# Patient Record
Sex: Male | Born: 1985 | Race: Black or African American | Hispanic: No | Marital: Single | Smoking: Current every day smoker
Health system: Southern US, Community
[De-identification: ages and names within clinical notes are randomized; demographics above are authoritative.]

## PROBLEM LIST (undated history)

## (undated) HISTORY — PX: HERNIA REPAIR: SHX51

---
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
## 2013-07-06 ENCOUNTER — Emergency Department (HOSPITAL_COMMUNITY)
Admission: EM | Admit: 2013-07-06 | Discharge: 2013-07-06 | Disposition: A | Discharge status: Home / Self Care | Payer: Self-pay | Attending: Emergency Medicine | Admitting: Emergency Medicine

## 2013-07-06 ENCOUNTER — Encounter (HOSPITAL_COMMUNITY): Payer: Self-pay | Admitting: Emergency Medicine

## 2013-07-06 DIAGNOSIS — IMO0002 Reserved for concepts with insufficient information to code with codable children: Secondary | ICD-10-CM | POA: Insufficient documentation

## 2013-07-06 DIAGNOSIS — Y9389 Activity, other specified: Secondary | ICD-10-CM | POA: Insufficient documentation

## 2013-07-06 DIAGNOSIS — M549 Dorsalgia, unspecified: Secondary | ICD-10-CM

## 2013-07-06 DIAGNOSIS — Y92410 Unspecified street and highway as the place of occurrence of the external cause: Secondary | ICD-10-CM | POA: Insufficient documentation

## 2013-07-06 MED ORDER — IBUPROFEN 400 MG PO TABS
800.0000 mg | ORAL_TABLET | Freq: Once | ORAL | Status: AC
  Administered 2013-07-06: 800 mg via ORAL
  Filled 2013-07-06: qty 2

## 2013-07-06 MED ORDER — NAPROXEN 500 MG PO TABS: 500.0000 mg | ORAL_TABLET | Freq: Two times a day (BID) | ORAL | Status: DC
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
## 2013-07-10 NOTE — ED Provider Notes (Signed)
Medical screening examination/treatment/procedure(s) were performed by non-physician practitioner and as supervising physician I was immediately available for consultation/collaboration.  EKG Interpretation   None        Derwood Kaplan, MD 07/10/13 1631

## 2015-03-26 ENCOUNTER — Emergency Department (HOSPITAL_COMMUNITY)
Admission: EM | Admit: 2015-03-26 | Discharge: 2015-03-26 | Discharge status: Against Medical Advice | Payer: Self-pay | Attending: Emergency Medicine | Admitting: Emergency Medicine

## 2015-03-26 ENCOUNTER — Encounter (HOSPITAL_COMMUNITY): Payer: Self-pay | Admitting: Emergency Medicine

## 2015-03-26 DIAGNOSIS — Z720 Tobacco use: Secondary | ICD-10-CM | POA: Insufficient documentation

## 2015-03-26 DIAGNOSIS — E876 Hypokalemia: Secondary | ICD-10-CM | POA: Insufficient documentation

## 2015-03-26 DIAGNOSIS — M62838 Other muscle spasm: Secondary | ICD-10-CM | POA: Insufficient documentation

## 2015-03-26 LAB — CBC
HEMATOCRIT: 41.4 % (ref 39.0–52.0)
HEMOGLOBIN: 13.8 g/dL (ref 13.0–17.0)
MCH: 30.1 pg (ref 26.0–34.0)
MCHC: 33.3 g/dL (ref 30.0–36.0)
MCV: 90.2 fL (ref 78.0–100.0)
Platelets: 293 10*3/uL (ref 150–400)
RBC: 4.59 MIL/uL (ref 4.22–5.81)
RDW: 12.5 % (ref 11.5–15.5)
WBC: 5.9 10*3/uL (ref 4.0–10.5)

## 2015-03-26 LAB — BASIC METABOLIC PANEL
Anion gap: 12 (ref 5–15)
BUN: 10 mg/dL (ref 6–20)
CALCIUM: 8.9 mg/dL (ref 8.9–10.3)
CHLORIDE: 97 mmol/L — AB (ref 101–111)
CO2: 23 mmol/L (ref 22–32)
Creatinine, Ser: 0.98 mg/dL (ref 0.61–1.24)
GFR calc non Af Amer: >60 mL/min (ref >60)
Glucose, Bld: 88 mg/dL (ref 65–99)
Potassium: 2.9 mmol/L — ABNORMAL LOW (ref 3.5–5.1)
Sodium: 132 mmol/L — ABNORMAL LOW (ref 135–145)

## 2015-03-26 LAB — MAGNESIUM: Magnesium: 2.1 mg/dL (ref 1.7–2.4)

## 2015-03-26 MED ORDER — POTASSIUM CITRATE ER 10 MEQ (1080 MG) PO TBCR
20.0000 meq | EXTENDED_RELEASE_TABLET | Freq: Once | ORAL | Status: DC
  Filled 2015-03-26: qty 2

## 2015-03-26 MED ORDER — POTASSIUM CHLORIDE CRYS ER 20 MEQ PO TBCR
40.0000 meq | EXTENDED_RELEASE_TABLET | Freq: Once | ORAL | Status: AC
  Administered 2015-03-26: 40 meq via ORAL
  Filled 2015-03-26: qty 2

## 2015-03-26 NOTE — ED Notes (Signed)
Pt sts cramp in abd muscle today that is still sore; pt sts hx of same in past; pt sts happened while straining to have BM

## 2015-03-26 NOTE — ED Notes (Signed)
Pt signed out AMA, stated "I just can't wait", informed provider.  Instructed pt on warning signs to look for to return back to the ED.

## 2015-03-26 NOTE — ED Notes (Signed)
Pt reports muscle cramps "for years" and can tell when it is going to happen.  Muscle cramps in both legs at this time.

## 2015-03-26 NOTE — ED Provider Notes (Signed)
CSN: [id number]     Arrival date & time 03/26/15  4098 History   First MD Initiated Contact with Patient 03/26/15 1932     Chief Complaint  Patient presents with  . Abdominal Pain   (Consider location/radiation/quality/duration/timing/severity/associated sxs/prior Treatment) HPI Patient is a previously healthy 29 year old [name] presented today for reports of spasms in his abdominal muscles. Patient reports over the last several years has had intermittent spasming of the muscles of his abdomen and his legs. Today he had episode where spasm occurred in his right abdominal wall and continued for several hours. Reports only mild nonradiating pain with this. Reports this resolved on its own but lasted longer than usual and wanted to be evaluated. On arrival to the emergency department reports spasming of the back muscles of his right leg. Denies any alleviating or aggravating factors for this. He does admit days had diarrhea for several months and has not had evaluation for this.  Denies any bloody or fatty stools.  History reviewed. No pertinent past medical history. History reviewed. No pertinent past surgical history. History reviewed. No pertinent family history. History  Substance Use Topics  . Smoking status: Current Every Day Smoker  . Smokeless tobacco: Not on file  . Alcohol Use: Yes    Review of Systems  Constitutional: Negative for fever and chills.  HENT: Negative for congestion and sore throat.   Eyes: Negative for pain.  Respiratory: Negative for cough and shortness of breath.   Cardiovascular: Negative for chest pain and palpitations.  Gastrointestinal: Negative for nausea, vomiting, abdominal pain and diarrhea.  Endocrine: Negative.   Genitourinary: Negative for flank pain.  Musculoskeletal: Negative for back pain and neck pain.       Muscle spasms of stomach and legs   Skin: Negative for rash.  Allergic/Immunologic: Negative.   Neurological: Negative for dizziness,  syncope and light-headedness.  Psychiatric/Behavioral: Negative for confusion.   Allergies  Review of patient's allergies indicates no known allergies.  Home Medications   Prior to Admission medications   Medication Sig Start Date End Date Taking? Authorizing Provider  naproxen (NAPROSYN) 500 MG tablet Take 1 tablet (500 mg total) by mouth 2 (two) times daily. Patient not taking: Reported on 03/26/2015 07/06/13   Tatyana Kirichenko, PA-C   BP 114/84 mmHg  Pulse 76  Temp(Src) 98.2 F (36.8 C) (Oral)  Resp 16  SpO2 99% Physical Exam  Constitutional: He is oriented to person, place, and time. He appears well-developed and well-nourished.  HENT:  Head: Normocephalic and atraumatic.  Eyes: Conjunctivae and EOM are normal. Pupils are equal, round, and reactive to light.  Neck: Normal range of motion. Neck supple.  Cardiovascular: Normal rate, regular rhythm, normal heart sounds and intact distal pulses.   Pulmonary/Chest: Effort normal and breath sounds normal. No respiratory distress.  Abdominal: Soft. Bowel sounds are normal. There is no tenderness.  Musculoskeletal: Normal range of motion.  Full ROM of all major joints with no spasms palpated.   Neurological: He is alert and oriented to person, place, and time. He has normal reflexes. No cranial nerve deficit.  Skin: Skin is warm and dry.    ED Course  Procedures (including critical care time) Labs Review Labs Reviewed  BASIC METABOLIC PANEL - Abnormal; Notable for the following:    Sodium 132 (*)    Potassium 2.9 (*)    Chloride 97 (*)    All other components within normal limits  CBC  MAGNESIUM   Imaging Review No results found.  EKG Interpretation None      MDM   Final diagnoses:  None   On initial evaluation patient was hemodynamically stable and in no acute distress. Abdomen was soft with no tenderness. Doubt any acute intra-abdominal pathology. Did not palpate any muscle spasm of the time. Felt most likely  due to electrolyte abnormality. BMP performed showing a potassium of 2.9. Ordered for patient to receive oral potassium replacement but patient eloped from the emergency department prior to my delivering of results, giving return precautions, or prescribing potassium for home use.  Advised pt to obtain PCP for condition during initial evaluation however.   If performed, labs, EKGs, and imaging were reviewed/interpreted by myself and my attending and incorporated into medical decision making..  Pt care supervised by my attending Dr. Donnald Garre.   Tery Sanfilippo, MD PGY-2  Emergency Medicine     Tery Sanfilippo, MD 03/27/15 1213  Arby Barrette, MD 04/02/15 1900

## 2015-06-14 ENCOUNTER — Emergency Department (HOSPITAL_COMMUNITY)
Admission: EM | Admit: 2015-06-14 | Discharge: 2015-06-14 | Disposition: A | Discharge status: Home / Self Care | Payer: Self-pay | Attending: Emergency Medicine | Admitting: Emergency Medicine

## 2015-06-14 ENCOUNTER — Emergency Department (HOSPITAL_COMMUNITY): Payer: Self-pay

## 2015-06-14 ENCOUNTER — Encounter (HOSPITAL_COMMUNITY): Payer: Self-pay | Admitting: *Deleted

## 2015-06-14 DIAGNOSIS — S3991XA Unspecified injury of abdomen, initial encounter: Secondary | ICD-10-CM | POA: Insufficient documentation

## 2015-06-14 DIAGNOSIS — Y998 Other external cause status: Secondary | ICD-10-CM | POA: Insufficient documentation

## 2015-06-14 DIAGNOSIS — Z720 Tobacco use: Secondary | ICD-10-CM | POA: Insufficient documentation

## 2015-06-14 DIAGNOSIS — Y92410 Unspecified street and highway as the place of occurrence of the external cause: Secondary | ICD-10-CM | POA: Insufficient documentation

## 2015-06-14 DIAGNOSIS — S3992XA Unspecified injury of lower back, initial encounter: Secondary | ICD-10-CM | POA: Insufficient documentation

## 2015-06-14 DIAGNOSIS — S0083XA Contusion of other part of head, initial encounter: Secondary | ICD-10-CM | POA: Insufficient documentation

## 2015-06-14 DIAGNOSIS — Y9389 Activity, other specified: Secondary | ICD-10-CM | POA: Insufficient documentation

## 2015-06-14 DIAGNOSIS — S0990XA Unspecified injury of head, initial encounter: Secondary | ICD-10-CM | POA: Insufficient documentation

## 2015-06-14 LAB — COMPREHENSIVE METABOLIC PANEL
ALK PHOS: 64 U/L (ref 38–126)
ALT: 18 U/L (ref 17–63)
AST: 26 U/L (ref 15–41)
Albumin: 4.7 g/dL (ref 3.5–5.0)
Anion gap: 8 (ref 5–15)
BUN: 12 mg/dL (ref 6–20)
CALCIUM: 9 mg/dL (ref 8.9–10.3)
CO2: 24 mmol/L (ref 22–32)
CREATININE: 0.73 mg/dL (ref 0.61–1.24)
Chloride: 109 mmol/L (ref 101–111)
GFR calc non Af Amer: >60 mL/min (ref >60)
Glucose, Bld: 105 mg/dL — ABNORMAL HIGH (ref 65–99)
Potassium: 3.8 mmol/L (ref 3.5–5.1)
SODIUM: 141 mmol/L (ref 135–145)
Total Bilirubin: 0.9 mg/dL (ref 0.3–1.2)
Total Protein: 7.5 g/dL (ref 6.5–8.1)

## 2015-06-14 LAB — URINALYSIS, ROUTINE W REFLEX MICROSCOPIC
BILIRUBIN URINE: NEGATIVE
Glucose, UA: NEGATIVE mg/dL
HGB URINE DIPSTICK: NEGATIVE
KETONES UR: NEGATIVE mg/dL
Leukocytes, UA: NEGATIVE
Nitrite: NEGATIVE
PROTEIN: NEGATIVE mg/dL
Specific Gravity, Urine: 1.017 (ref 1.005–1.030)
Urobilinogen, UA: 1 mg/dL (ref 0.0–1.0)
pH: 6 (ref 5.0–8.0)

## 2015-06-14 LAB — CBC WITH DIFFERENTIAL/PLATELET
Basophils Absolute: 0 10*3/uL (ref 0.0–0.1)
Basophils Relative: 0 %
Eosinophils Absolute: 0.2 10*3/uL (ref 0.0–0.7)
Eosinophils Relative: 4 %
HCT: 39.5 % (ref 39.0–52.0)
HEMOGLOBIN: 12.9 g/dL — AB (ref 13.0–17.0)
LYMPHS ABS: 1.2 10*3/uL (ref 0.7–4.0)
LYMPHS PCT: 21 %
MCH: 29.5 pg (ref 26.0–34.0)
MCHC: 32.7 g/dL (ref 30.0–36.0)
MCV: 90.2 fL (ref 78.0–100.0)
Monocytes Absolute: 0.5 10*3/uL (ref 0.1–1.0)
Monocytes Relative: 8 %
NEUTROS PCT: 67 %
Neutro Abs: 3.8 10*3/uL (ref 1.7–7.7)
Platelets: 310 10*3/uL (ref 150–400)
RBC: 4.38 MIL/uL (ref 4.22–5.81)
RDW: 12.4 % (ref 11.5–15.5)
WBC: 5.8 10*3/uL (ref 4.0–10.5)

## 2015-06-14 LAB — ETHANOL: ALCOHOL ETHYL (B): 204 mg/dL — AB (ref <5)

## 2015-06-14 LAB — LIPASE, BLOOD: Lipase: 44 U/L (ref 22–51)

## 2015-06-14 LAB — I-STAT CG4 LACTIC ACID, ED: LACTIC ACID, VENOUS: 1.73 mmol/L (ref 0.5–2.0)

## 2015-06-14 MED ORDER — HYDROCODONE-ACETAMINOPHEN 5-325 MG PO TABS
2.0000 | ORAL_TABLET | Freq: Once | ORAL | Status: AC
  Administered 2015-06-14: 2 via ORAL
  Filled 2015-06-14: qty 2

## 2015-06-14 MED ORDER — SODIUM CHLORIDE 0.9 % IV BOLUS (SEPSIS)
1000.0000 mL | Freq: Once | INTRAVENOUS | Status: AC
  Administered 2015-06-14: 1000 mL via INTRAVENOUS

## 2015-06-14 MED ORDER — CYCLOBENZAPRINE HCL 10 MG PO TABS
10.0000 mg | ORAL_TABLET | Freq: Once | ORAL | Status: AC
  Administered 2015-06-14: 10 mg via ORAL
  Filled 2015-06-14: qty 1

## 2015-06-14 MED ORDER — CYCLOBENZAPRINE HCL 10 MG PO TABS: 10.0000 mg | ORAL_TABLET | Freq: Two times a day (BID) | ORAL | Status: DC | PRN

## 2015-06-14 MED ORDER — IBUPROFEN 800 MG PO TABS: 800.0000 mg | ORAL_TABLET | Freq: Three times a day (TID) | ORAL | Status: DC

## 2015-06-14 MED ORDER — IOHEXOL 350 MG/ML SOLN
100.0000 mL | Freq: Once | INTRAVENOUS | Status: AC | PRN
  Administered 2015-06-14: 100 mL via INTRAVENOUS
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
## 2015-06-14 NOTE — ED Notes (Signed)
Patient transported to X-ray 
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
## 2016-10-08 ENCOUNTER — Encounter (HOSPITAL_COMMUNITY): Payer: Self-pay | Admitting: Emergency Medicine

## 2016-10-08 ENCOUNTER — Ambulatory Visit (HOSPITAL_COMMUNITY)
Admission: EM | Admit: 2016-10-08 | Discharge: 2016-10-08 | Disposition: A | Discharge status: Home / Self Care | Payer: Self-pay | Attending: Family Medicine | Admitting: Family Medicine

## 2016-10-08 DIAGNOSIS — Z202 Contact with and (suspected) exposure to infections with a predominantly sexual mode of transmission: Secondary | ICD-10-CM

## 2016-10-08 DIAGNOSIS — F17210 Nicotine dependence, cigarettes, uncomplicated: Secondary | ICD-10-CM | POA: Insufficient documentation

## 2016-10-08 MED ORDER — METRONIDAZOLE 500 MG PO TABS
ORAL_TABLET | ORAL | Status: AC
  Filled 2016-10-08: qty 4

## 2016-10-08 MED ORDER — METRONIDAZOLE 500 MG PO TABS
2000.0000 mg | ORAL_TABLET | Freq: Once | ORAL | Status: AC
  Administered 2016-10-08: 2000 mg via ORAL
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
## 2016-10-08 NOTE — ED Notes (Signed)
Dirty urine collected. 

## 2016-10-09 LAB — URINE CYTOLOGY ANCILLARY ONLY
Chlamydia: NEGATIVE
Neisseria Gonorrhea: NEGATIVE
Trichomonas: NEGATIVE
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
## 2018-07-18 ENCOUNTER — Encounter (HOSPITAL_COMMUNITY): Payer: Self-pay | Admitting: Emergency Medicine

## 2018-07-18 ENCOUNTER — Emergency Department (HOSPITAL_COMMUNITY): Payer: Self-pay

## 2018-07-18 ENCOUNTER — Emergency Department (HOSPITAL_COMMUNITY)
Admission: EM | Admit: 2018-07-18 | Discharge: 2018-07-18 | Disposition: A | Discharge status: Home / Self Care | Payer: Self-pay | Attending: Emergency Medicine | Admitting: Emergency Medicine

## 2018-07-18 DIAGNOSIS — R1031 Right lower quadrant pain: Secondary | ICD-10-CM | POA: Insufficient documentation

## 2018-07-18 DIAGNOSIS — F17210 Nicotine dependence, cigarettes, uncomplicated: Secondary | ICD-10-CM | POA: Insufficient documentation

## 2018-07-18 LAB — URINALYSIS, ROUTINE W REFLEX MICROSCOPIC
BILIRUBIN URINE: NEGATIVE
GLUCOSE, UA: NEGATIVE mg/dL
Hgb urine dipstick: NEGATIVE
Ketones, ur: NEGATIVE mg/dL
Leukocytes, UA: NEGATIVE
NITRITE: NEGATIVE
PROTEIN: NEGATIVE mg/dL
Specific Gravity, Urine: 1.03 (ref 1.005–1.030)
pH: 6 (ref 5.0–8.0)

## 2018-07-18 LAB — COMPREHENSIVE METABOLIC PANEL
ALBUMIN: 4 g/dL (ref 3.5–5.0)
ALT: 59 U/L — ABNORMAL HIGH (ref 0–44)
ANION GAP: 10 (ref 5–15)
AST: 88 U/L — ABNORMAL HIGH (ref 15–41)
Alkaline Phosphatase: 89 U/L (ref 38–126)
BILIRUBIN TOTAL: 0.6 mg/dL (ref 0.3–1.2)
BUN: 9 mg/dL (ref 6–20)
CALCIUM: 8.9 mg/dL (ref 8.9–10.3)
CO2: 24 mmol/L (ref 22–32)
Chloride: 108 mmol/L (ref 98–111)
Creatinine, Ser: 1.01 mg/dL (ref 0.61–1.24)
GLUCOSE: 98 mg/dL (ref 70–99)
Potassium: 3.5 mmol/L (ref 3.5–5.1)
Sodium: 142 mmol/L (ref 135–145)
TOTAL PROTEIN: 6.6 g/dL (ref 6.5–8.1)

## 2018-07-18 LAB — CBC
HCT: 41.7 % (ref 39.0–52.0)
Hemoglobin: 12.9 g/dL — ABNORMAL LOW (ref 13.0–17.0)
MCH: 29.2 pg (ref 26.0–34.0)
MCHC: 30.9 g/dL (ref 30.0–36.0)
MCV: 94.3 fL (ref 80.0–100.0)
PLATELETS: 309 10*3/uL (ref 150–400)
RBC: 4.42 MIL/uL (ref 4.22–5.81)
RDW: 12 % (ref 11.5–15.5)
WBC: 5.1 10*3/uL (ref 4.0–10.5)
nRBC: 0 % (ref 0.0–0.2)

## 2018-07-18 LAB — LIPASE, BLOOD: Lipase: 89 U/L — ABNORMAL HIGH (ref 11–51)

## 2018-07-18 MED ORDER — MORPHINE SULFATE (PF) 4 MG/ML IV SOLN
4.0000 mg | Freq: Once | INTRAVENOUS | Status: AC
  Administered 2018-07-18: 4 mg via INTRAVENOUS
  Filled 2018-07-18: qty 1

## 2018-07-18 MED ORDER — IOHEXOL 300 MG/ML  SOLN
100.0000 mL | Freq: Once | INTRAMUSCULAR | Status: AC | PRN
  Administered 2018-07-18: 100 mL via INTRAVENOUS

## 2018-07-18 NOTE — ED Provider Notes (Signed)
[facility]ENCY [facility] Provider Note   CSN: [id number] Arrival date & time: 07/18/18  0109     History   Chief Complaint Chief Complaint  Patient presents with  . Abdominal Pain    HPI John [name] is a 32 y.o. male.  The history is provided by the patient and medical records. No language interpreter was used.  Abdominal Pain     John [name] is a 32 y.o. male who presents to the Emergency Department complaining of acute onset of RLQ abdominal pain which began tonight during intercourse. Feels better when holding pressure to the right side of his abdomen. Worse with palpation. Reports history of similar which will typically resolve after he sits down for a long time. He has seen a bulging to the area when he has pain, however did not see the bulging today. No n/v/d, constipation or fevers. No urinary symptoms. No testicular pain/swelling.   History reviewed. No pertinent past medical history.  There are no active problems to display for this patient.   Past Surgical History:  Procedure Laterality Date  . HERNIA REPAIR          Home Medications    Prior to Admission medications   Not on File    Family History No family history on file.  Social History Social History   Tobacco Use  . Smoking status: Current Every Day Smoker    Types: Cigarettes  . Smokeless tobacco: Never Used  Substance Use Topics  . Alcohol use: Yes  . Drug use: Yes    Types: Marijuana     Allergies   Cauliflower [brassica oleracea italica]   Review of Systems Review of Systems  Gastrointestinal: Positive for abdominal pain.  All other systems reviewed and are negative.    Physical Exam Updated Vital Signs BP 103/71 (BP Location: Right Arm)   Pulse 74   Temp 98.2 F (36.8 C) (Oral)   Resp 18   Ht 5\' 3"  (1.6 m)   Wt 65.8 kg   SpO2 98%   BMI 25.69 kg/m   Physical Exam  Constitutional: He is oriented to person, place, and time. He  appears well-developed and well-nourished. No distress.  HENT:  Head: Normocephalic and atraumatic.  Cardiovascular: Normal rate, regular rhythm and normal heart sounds.  No murmur heard. Pulmonary/Chest: Effort normal and breath sounds normal. No respiratory distress.  Abdominal: Soft. He exhibits no distension.  Significant tenderness to the RLQ and right inguinal region.  Genitourinary:  Genitourinary Comments: Chaperone present for exam. No discharge from penis. No signs of lesion or erythema on the penis or testicles. The penis and testicles are nontender. No testicular masses or swelling. Cremaster reflex present bilaterally.  Neurological: He is alert and oriented to person, place, and time.  Skin: Skin is warm and dry.  Nursing note and vitals reviewed.    ED Treatments / Results  Labs (all labs ordered are listed, but only abnormal results are displayed) Labs Reviewed  LIPASE, BLOOD - Abnormal; Notable for the following components:      Result Value   Lipase 89 (*)    All other components within normal limits  COMPREHENSIVE METABOLIC PANEL - Abnormal; Notable for the following components:   AST 88 (*)    ALT 59 (*)    All other components within normal limits  CBC - Abnormal; Notable for the following components:   Hemoglobin 12.9 (*)    All other components within normal limits  URINALYSIS, ROUTINE  W REFLEX MICROSCOPIC    EKG None  Radiology Ct Abdomen Pelvis W Contrast  Result Date: 07/18/2018 CLINICAL DATA:  Acute onset of right lower quadrant abdominal pain. EXAM: CT ABDOMEN AND PELVIS WITH CONTRAST TECHNIQUE: Multidetector CT imaging of the abdomen and pelvis was performed using the standard protocol following bolus administration of intravenous contrast. CONTRAST:  OMNIPAQUE IOHEXOL 300 MG/ML  SOLN COMPARISON:  CT of the abdomen and pelvis performed 06/14/2015 FINDINGS: Lower chest: The visualized lung bases are grossly clear. The visualized portions of  the mediastinum are unremarkable. Hepatobiliary: The liver is unremarkable in appearance. The gallbladder is unremarkable in appearance. The common bile duct remains normal in caliber. Pancreas: The pancreas is within normal limits. Spleen: The spleen is unremarkable in appearance. Adrenals/Urinary Tract: The adrenal glands are unremarkable in appearance. The kidneys are within normal limits. There is no evidence of hydronephrosis. No renal or ureteral stones are identified. No perinephric stranding is seen. Stomach/Bowel: The stomach is unremarkable in appearance. The small bowel is within normal limits. The appendix is normal in caliber, without evidence of appendicitis. The colon is unremarkable in appearance. Vascular/Lymphatic: The abdominal aorta is unremarkable in appearance. The inferior vena cava is grossly unremarkable. No retroperitoneal lymphadenopathy is seen. No pelvic sidewall lymphadenopathy is identified. Reproductive: The bladder is mildly distended and grossly unremarkable. The prostate is normal in size. Other: No additional soft tissue abnormalities are seen. Musculoskeletal: No acute osseous abnormalities are identified. The visualized musculature is unremarkable in appearance. IMPRESSION: Unremarkable contrast-enhanced CT of the abdomen and pelvis. No evidence of appendicitis. Electronically Signed   By: Roanna Raider M.D.   On: 07/18/2018 03:10    Procedures Procedures (including critical care time)  Medications Ordered in ED Medications  morphine 4 MG/ML injection 4 mg (4 mg Intravenous Given 07/18/18 0155)  iohexol (OMNIPAQUE) 300 MG/ML solution 100 mL (100 mLs Intravenous Contrast Given 07/18/18 0234)     Initial Impression / Assessment and Plan / ED Course  I have reviewed the triage vital signs and the nursing notes.  Pertinent labs & imaging results that were available during my care of the patient were reviewed by me and considered in my medical decision making (see  chart for details).    John [name] is a 32 y.o. male who presents to ED for RLQ abdominal pain. Labs and CT abd/pelvis ordered. Care assumed by attending, Dr. Eudelia Bunch who will follow up on results and dispo appropriately.   Final Clinical Impressions(s) / ED Diagnoses   Final diagnoses:  Right lower quadrant abdominal pain    ED Discharge Orders    None       Ward, Chase Picket, PA-C 07/18/18 0439    Nira Conn, MD 07/18/18 [phone]

## 2018-07-18 NOTE — ED Provider Notes (Signed)
32 year old male who presents with right lower quadrant abdominal pain.  Reports intermittent for several months.  Believes is a hernia that popping in and out.  Work-up reassuring without leukocytosis or anemia.  No significant electrolyte derangements or renal insufficiency.  Mildly elevated AST and ALT with lipase does not correlate with patient's symptoms.  Urinalysis negative.  CT of the abdomen negative for appendicitis or hernia.  No other serious intra-abdominal inflammatory/infectious process noted on imaging.  The patient appears reasonably screened and/or stabilized for discharge and I doubt any other medical condition or other [facility] requiring further screening, evaluation, or treatment in the ED at this time prior to discharge.  The patient is safe for discharge with strict return precautions.   Disposition: Discharge  Condition: Good  I have discussed the results, Dx and Tx plan with the patient who expressed understanding and agree(s) with the plan. Discharge instructions discussed at great length. The patient was given strict return precautions who verbalized understanding of the instructions. No further questions at time of discharge.    ED Discharge Orders    None       Follow Up: Kinsinger, De BlanchLuke Aaron, MD [address] [place] [address] [phone]  Schedule an appointment as soon as possible for a visit  If symptoms do not improve or  worsen to evaluate for possible hernia      Cardama, Amadeo GarnetPedro Eduardo, MD 07/18/18 0410

## 2018-07-18 NOTE — ED Triage Notes (Signed)
RLQ abdominal pain that started this evening.  Reports pain is better if he holds pressure to his abdomen.

## 2018-07-18 NOTE — Discharge Instructions (Addendum)

## 2019-06-07 ENCOUNTER — Emergency Department (HOSPITAL_COMMUNITY)
Admission: EM | Admit: 2019-06-07 | Discharge: 2019-06-08 | Disposition: A | Discharge status: Home / Self Care | Payer: Self-pay | Attending: Emergency Medicine | Admitting: Emergency Medicine

## 2019-06-07 ENCOUNTER — Other Ambulatory Visit: Payer: Self-pay

## 2019-06-07 ENCOUNTER — Encounter (HOSPITAL_COMMUNITY): Payer: Self-pay | Admitting: Emergency Medicine

## 2019-06-07 DIAGNOSIS — F17210 Nicotine dependence, cigarettes, uncomplicated: Secondary | ICD-10-CM | POA: Insufficient documentation

## 2019-06-07 DIAGNOSIS — F1210 Cannabis abuse, uncomplicated: Secondary | ICD-10-CM | POA: Insufficient documentation

## 2019-06-07 DIAGNOSIS — R82998 Other abnormal findings in urine: Secondary | ICD-10-CM | POA: Insufficient documentation

## 2019-06-07 DIAGNOSIS — R748 Abnormal levels of other serum enzymes: Secondary | ICD-10-CM | POA: Insufficient documentation

## 2019-06-07 NOTE — ED Triage Notes (Signed)
Patient feeling like he has cramps in his thighs.  He states that this is day two for the cramping.

## 2019-06-08 LAB — BASIC METABOLIC PANEL
Anion gap: 12 (ref 5–15)
BUN: 11 mg/dL (ref 6–20)
CO2: 23 mmol/L (ref 22–32)
Calcium: 9 mg/dL (ref 8.9–10.3)
Chloride: 105 mmol/L (ref 98–111)
Creatinine, Ser: 1.09 mg/dL (ref 0.61–1.24)
GFR calc Af Amer: >60 mL/min (ref >60)
GFR calc non Af Amer: >60 mL/min (ref >60)
Glucose, Bld: 126 mg/dL — ABNORMAL HIGH (ref 70–99)
Potassium: 4 mmol/L (ref 3.5–5.1)
Sodium: 140 mmol/L (ref 135–145)

## 2019-06-08 LAB — CBC
HCT: 40.9 % (ref 39.0–52.0)
Hemoglobin: 13.7 g/dL (ref 13.0–17.0)
MCH: 31.6 pg (ref 26.0–34.0)
MCHC: 33.5 g/dL (ref 30.0–36.0)
MCV: 94.5 fL (ref 80.0–100.0)
Platelets: 277 10*3/uL (ref 150–400)
RBC: 4.33 MIL/uL (ref 4.22–5.81)
RDW: 12.1 % (ref 11.5–15.5)
WBC: 5.6 10*3/uL (ref 4.0–10.5)
nRBC: 0 % (ref 0.0–0.2)

## 2019-06-08 LAB — URINALYSIS, ROUTINE W REFLEX MICROSCOPIC
Bilirubin Urine: NEGATIVE
Glucose, UA: NEGATIVE mg/dL
Hgb urine dipstick: NEGATIVE
Ketones, ur: 5 mg/dL — AB
Leukocytes,Ua: NEGATIVE
Nitrite: NEGATIVE
Protein, ur: NEGATIVE mg/dL
Specific Gravity, Urine: 1.027 (ref 1.005–1.030)
pH: 6 (ref 5.0–8.0)

## 2019-06-08 LAB — CK: Total CK: 1595 U/L — ABNORMAL HIGH (ref 49–397)

## 2019-06-08 MED ORDER — SODIUM CHLORIDE 0.9 % IV BOLUS
1000.0000 mL | Freq: Once | INTRAVENOUS | Status: AC
  Administered 2019-06-08: 05:00:00 1000 mL via INTRAVENOUS

## 2019-06-08 MED ORDER — SODIUM CHLORIDE 0.9 % IV BOLUS: 1000.0000 mL | Freq: Once | INTRAVENOUS | Status: DC

## 2019-06-08 NOTE — ED Notes (Signed)
Pt called for labs no answer  

## 2019-06-08 NOTE — ED Notes (Signed)
Pt called for vitals no answer x3 

## 2019-06-08 NOTE — Discharge Instructions (Signed)
Your workup today indicated rhabdomyolysis, a condition where muscle cells are damaged. This typically happens in the setting of trauma or dehydration.  However your lab work today is reassuring.  Please make sure that you drink enough water daily, at least 8 eight oz glasses of water daily or 3 water bottles.  Please follow-up with primary care provider, urgent care, or emergency department for recheck of your kidney function and CK levels within 24 to 48 hours to ensure that these have improved.  Return to emergency department immediately for any concerning signs or symptoms develop such as high fevers, persistent vomiting, severe pain, weakness in the legs.

## 2019-06-08 NOTE — ED Provider Notes (Signed)
[place] EMERGENCY DEPARTMENT Provider Note   CSN: [id number] Arrival date & time: 06/07/19  2312     History   Chief Complaint Chief Complaint  Patient presents with  . Leg Pain    HPI John [name] is a 33 y.o. male presenting for evaluation of bilateral thigh cramping for 2 days.  He denies chest pain, shortness of breath, abdominal pain, nausea, vomiting, fevers, or chills.  He does note that his urine has been darker during this time as well.  He tells me that he started working out with a group of other people 2 days ago.  He tells me he thinks he does not drink enough water on a daily basis.  He has not tried anything for his symptoms.  No aggravating or alleviating factors noted.  He tells me he is experienced similar leg cramping intermittently for a few years now but this is worse than usual.     The history is provided by the patient.    History reviewed. No pertinent past medical history.  There are no active problems to display for this patient.   Past Surgical History:  Procedure Laterality Date  . HERNIA REPAIR          Home Medications    Prior to Admission medications   Not on File    Family History History reviewed. No pertinent family history.  Social History Social History   Tobacco Use  . Smoking status: Current Every Day Smoker    Types: Cigarettes  . Smokeless tobacco: Never Used  Substance Use Topics  . Alcohol use: Yes  . Drug use: Yes    Types: Marijuana     Allergies   Cauliflower [brassica oleracea italica]   Review of Systems Review of Systems  Constitutional: Negative for chills and fever.  Respiratory: Negative for shortness of breath.   Cardiovascular: Negative for chest pain.  Gastrointestinal: Negative for abdominal pain, nausea and vomiting.  Genitourinary: Negative for dysuria, frequency, hematuria and urgency.  Musculoskeletal: Positive for myalgias.  Neurological: Negative for weakness  and numbness.     Physical Exam Updated Vital Signs BP 115/75 (BP Location: Right Arm)   Pulse 85   Temp 97.7 F (36.5 C) (Oral)   Resp 18   SpO2 97%   Physical Exam Vitals signs and nursing note reviewed.  Constitutional:      General: He is not in acute distress.    Appearance: He is well-developed.  HENT:     Head: Normocephalic and atraumatic.  Eyes:     General:        Right eye: No discharge.        Left eye: No discharge.     Conjunctiva/sclera: Conjunctivae normal.  Neck:     Musculoskeletal: Neck supple.     Vascular: No JVD.     Trachea: No tracheal deviation.  Cardiovascular:     Rate and Rhythm: Normal rate and regular rhythm.     Pulses: Normal pulses.     Heart sounds: Normal heart sounds.     Comments: 2+ radial and DP/PT pulses bilaterally, Homans sign absent bilaterally, no lower extremity edema, no palpable cords, compartments are soft  Pulmonary:     Effort: Pulmonary effort is normal.     Breath sounds: Normal breath sounds.  Abdominal:     General: Abdomen is flat. Bowel sounds are normal. There is no distension.     Palpations: Abdomen is soft.     Tenderness:  There is no abdominal tenderness. There is no guarding or rebound.  Musculoskeletal:        General: No swelling, tenderness or deformity.  Skin:    General: Skin is warm and dry.     Findings: No erythema.  Neurological:     General: No focal deficit present.     Mental Status: He is alert.  Psychiatric:        Behavior: Behavior normal.      ED Treatments / Results  Labs (all labs ordered are listed, but only abnormal results are displayed) Labs Reviewed  BASIC METABOLIC PANEL - Abnormal; Notable for the following components:      Result Value   Glucose, Bld 126 (*)    All other components within normal limits  CK - Abnormal; Notable for the following components:   Total CK 1,595 (*)    All other components within normal limits  URINALYSIS, ROUTINE W REFLEX MICROSCOPIC -  Abnormal; Notable for the following components:   Ketones, ur 5 (*)    All other components within normal limits  CBC    EKG None  Radiology No results found.  Procedures Procedures (including critical care time)  Medications Ordered in ED Medications  sodium chloride 0.9 % bolus 1,000 mL (0 mLs Intravenous Stopped 06/08/19 0539)     Initial Impression / Assessment and Plan / ED Course  I have reviewed the triage vital signs and the nursing notes.  Pertinent labs & imaging results that were available during my care of the patient were reviewed by me and considered in my medical decision making (see chart for details).        Patient presenting today for evaluation of bilateral lower extremity cramping for 2 days.  Recently started exercising.  He is afebrile, vital signs are stable.  He is nontoxic in appearance.  He is neurovascularly intact.  No evidence of compartment syndrome and no history of trauma.  Lab work today reviewed by me shows no leukocytosis, no anemia, no metabolic derangements, no renal insufficiency.  His CK is elevated at 1595 suggestive of rhabdomyolysis.  His UA shows no bilirubin but mild ketonuria.  He is likely dehydrated.  We will give IV fluids. He is overall quite well-appearing and is tolerating p.o. food and fluids without difficulty. Recommend follow-up with PCP or return to the ED or go to urgent care for recheck of his creatinine and CK within 24 to 48 hours.  Discussed importance of fluid rehydration.  Discussed indications for return to the ED sooner. Patient verbalized understanding of and agreement with plan and is safe for discharge home at this time.   Final Clinical Impressions(s) / ED Diagnoses   Final diagnoses:  Elevated CK    ED Discharge Orders    None       Jeanie Sewer, PA-C 06/08/19 0546    Dione Booze, MD 06/08/19 [phone]

## 2019-07-13 ENCOUNTER — Emergency Department (HOSPITAL_COMMUNITY)
Admission: EM | Admit: 2019-07-13 | Discharge: 2019-07-14 | Disposition: A | Discharge status: Home / Self Care | Payer: Self-pay | Attending: Emergency Medicine | Admitting: Emergency Medicine

## 2019-07-13 ENCOUNTER — Emergency Department (HOSPITAL_COMMUNITY): Payer: Self-pay

## 2019-07-13 ENCOUNTER — Encounter (HOSPITAL_COMMUNITY): Payer: Self-pay | Admitting: Emergency Medicine

## 2019-07-13 ENCOUNTER — Other Ambulatory Visit: Payer: Self-pay

## 2019-07-13 DIAGNOSIS — L301 Dyshidrosis [pompholyx]: Secondary | ICD-10-CM | POA: Insufficient documentation

## 2019-07-13 DIAGNOSIS — L03115 Cellulitis of right lower limb: Secondary | ICD-10-CM | POA: Insufficient documentation

## 2019-07-13 DIAGNOSIS — F17210 Nicotine dependence, cigarettes, uncomplicated: Secondary | ICD-10-CM | POA: Insufficient documentation

## 2019-07-13 NOTE — ED Notes (Signed)
Pt did not respond when called for vitals check 

## 2019-07-13 NOTE — ED Triage Notes (Signed)
Patient reports worsening skin infection at right foot for several weeks with swelling / drainage , denies fever or chills .

## 2019-07-14 MED ORDER — CEPHALEXIN 500 MG PO CAPS: 500.0000 mg | ORAL_CAPSULE | Freq: Four times a day (QID) | ORAL | 0 refills | Status: DC

## 2019-07-14 MED ORDER — CLOTRIMAZOLE 1 % EX CREA: TOPICAL_CREAM | CUTANEOUS | 0 refills | Status: DC

## 2019-07-14 NOTE — ED Notes (Signed)
Discharge instructions and prescriptions discussed with Pt. Pt verbalized understanding. Pt stable and ambulatory.   

## 2019-07-14 NOTE — ED Provider Notes (Signed)
[facility] EMERGENCY DEPARTMENT Provider Note   CSN: [id number] Arrival date & time: 07/13/19  2016     History   Chief Complaint Chief Complaint  Patient presents with  . infected right foot eczema    HPI John [name] is a 33 y.o. male.     The history is provided by the patient. No language interpreter was used.     33 year old male with hx of eczema here with R foot pain.  Patient report for the past 2 to 3 weeks he noticed increasing eczema outbreak to both of his feet right greater than left.  Within the past 2 weeks he noticed increasing swelling and soreness as well as itchiness to the dorsum of his right foot.  Report worsening symptoms has not helped much.  He did tried some over-the-counter medication with some relief.  He described the sensation as a throbbing swelling sensation.  He endorsed mild nausea without any fever or chills.  He denies any recent injury.  He has history of athlete's foot in the past.  His discomfort is rated as moderate and nonradiating.  He does use tobacco.  He denies any environmental changes.   History reviewed. No pertinent past medical history.  There are no active problems to display for this patient.   Past Surgical History:  Procedure Laterality Date  . HERNIA REPAIR          Home Medications    Prior to Admission medications   Not on File    Family History No family history on file.  Social History Social History   Tobacco Use  . Smoking status: Current Every Day Smoker    Types: Cigarettes  . Smokeless tobacco: Never Used  Substance Use Topics  . Alcohol use: Yes  . Drug use: Yes    Types: Marijuana     Allergies   Cauliflower [brassica oleracea]   Review of Systems Review of Systems  Constitutional: Negative for fever.  Skin: Positive for rash.     Physical Exam Updated Vital Signs BP 122/86 (BP Location: Right Arm)   Pulse 74   Temp 98 F (36.7 C) (Oral)   Resp 14    SpO2 100%   Physical Exam Vitals signs and nursing note reviewed.  Constitutional:      General: He is not in acute distress.    Appearance: He is well-developed.  HENT:     Head: Atraumatic.  Eyes:     Conjunctiva/sclera: Conjunctivae normal.  Neck:     Musculoskeletal: Neck supple.  Skin:    Comments: Right foot: An area of skin desquamation approximately 3 x 4 cm to the dorsum of distal foot involving the webspace of several toes.  Surrounding skin erythema warmth and edema appreciated.  Multiple dyshidrotic skin changes to the surrounding skin tissue.  Dorsalis pedis pulse palpable with brisk cap refill.  Left foot: Hyperpigmented skin changes noted to the dorsum of foot adjacent to the toes measuring approximately 2 x 3 cm with dyshidrotic vesicles but no erythema edema or warmth.  Dorsalis pulse palpable with brisk cap refill.  Neurological:     Mental Status: He is alert.  Psychiatric:        Mood and Affect: Mood normal.      ED Treatments / Results  Labs (all labs ordered are listed, but only abnormal results are displayed) Labs Reviewed - No data to display  EKG None  Radiology Dg Foot Complete Right  Result  Date: 07/13/2019 CLINICAL DATA:  Plantar foot infection EXAM: RIGHT FOOT COMPLETE - 3+ VIEW COMPARISON:  None. FINDINGS: There is no evidence of fracture or dislocation. There is no evidence of arthropathy or other focal bone abnormality. Soft tissues are unremarkable. Small plantar calcaneal spur. No radiopaque foreign body. IMPRESSION: No acute osseous abnormality Electronically Signed   By: Donavan Foil M.D.   On: 07/13/2019 21:46    Procedures Procedures (including critical care time)  Medications Ordered in ED Medications - No data to display   Initial Impression / Assessment and Plan / ED Course  I have reviewed the triage vital signs and the nursing notes.  Pertinent labs & imaging results that were available during my care of the patient were  reviewed by me and considered in my medical decision making (see chart for details).        BP 122/86 (BP Location: Right Arm)   Pulse 74   Temp 98 F (36.7 C) (Oral)   Resp 14   SpO2 100%    Final Clinical Impressions(s) / ED Diagnoses   Final diagnoses:  Cellulitis of right foot  Dyshidrotic eczema    ED Discharge Orders         Ordered    cephALEXin (KEFLEX) 500 MG capsule  4 times daily     07/14/19 1250    clotrimazole (LOTRIMIN) 1 % cream     07/14/19 1250         12:01 PM Patient with history of eczema here with right foot pain and swelling, with appearance suggestive of infected eczema.  Differential may include tinea pedis.  Plan to provide antibiotic to treat for cellulitis, as well as providing antifungal cream.  Will suggest patient to follow-up closely with podiatry for further management of his condition.  X-ray right foot unremarkable.   Domenic Moras, PA-C 07/14/19 1252    Tegeler, Gwenyth Allegra, MD 07/14/19 1726

## 2019-07-14 NOTE — ED Notes (Signed)
Woke pt for vitals check and he said "just come back later"

## 2019-07-14 NOTE — Discharge Instructions (Signed)
It appears that you have developed skin infection overlying your right foot.  Take antibiotic as prescribed.  Apply antifungal cream to affected area as prescribed.  Please call and follow-up closely with podiatry for further management of your condition.

## 2020-11-17 IMAGING — CR DG FOOT COMPLETE 3+V*R*
3 series · 3 of 3 positions shown · non-contrast
Comparison: None.

CLINICAL DATA: Plantar foot infection

EXAM:
RIGHT FOOT COMPLETE - 3+ VIEW

[foot ap]
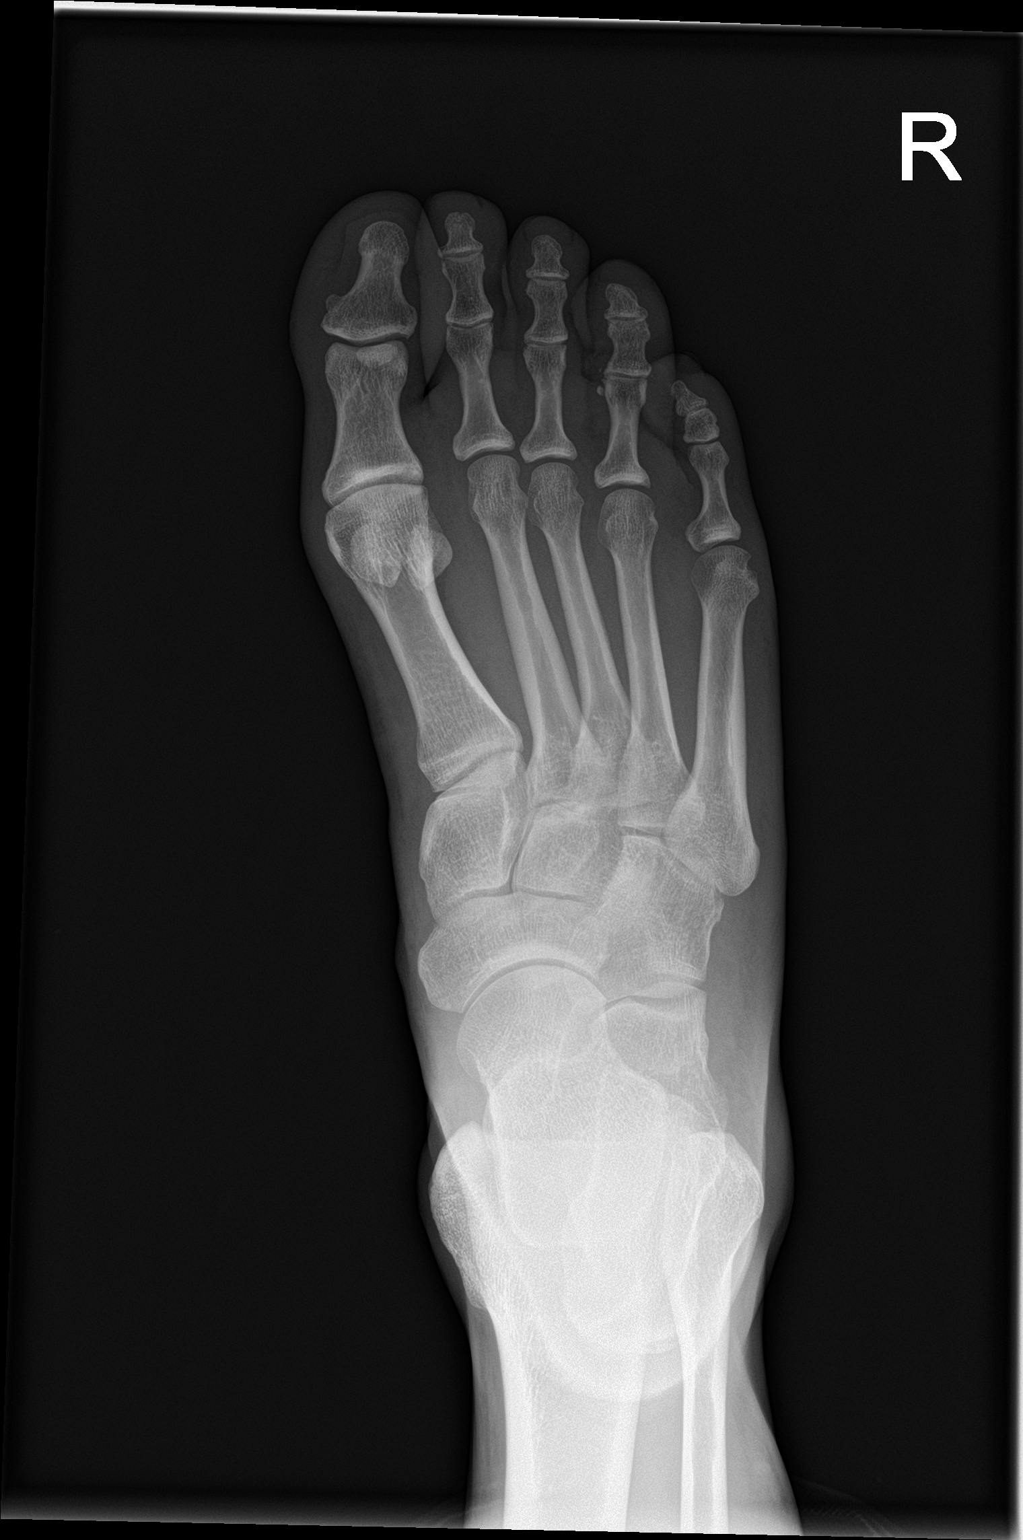

[foot obl]
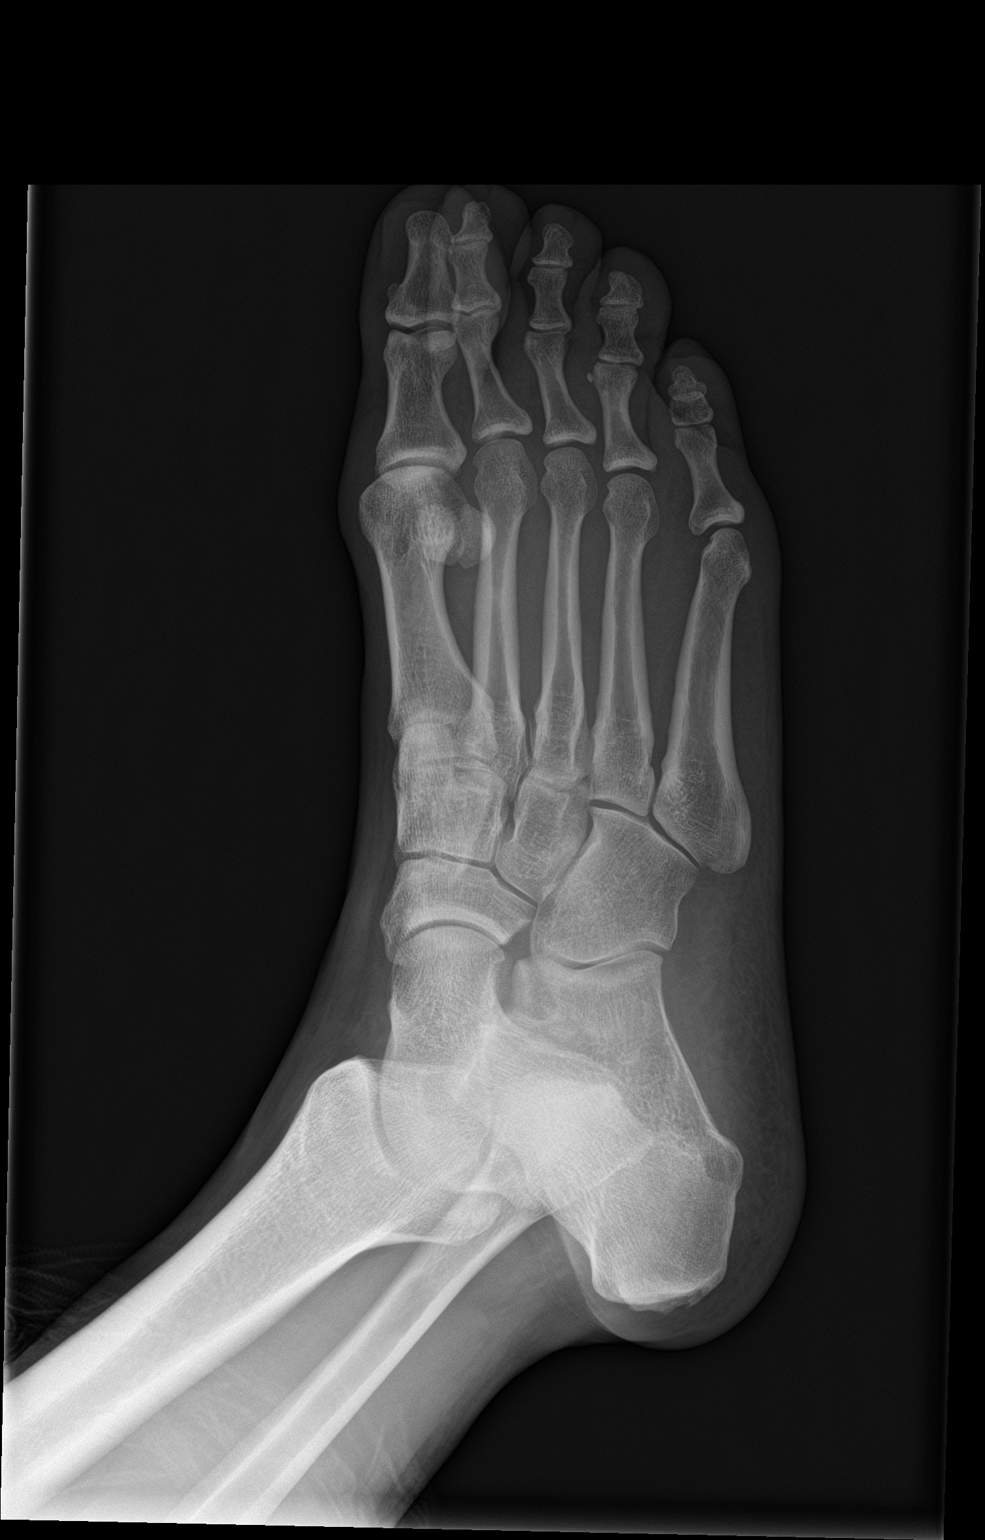

[foot lat]
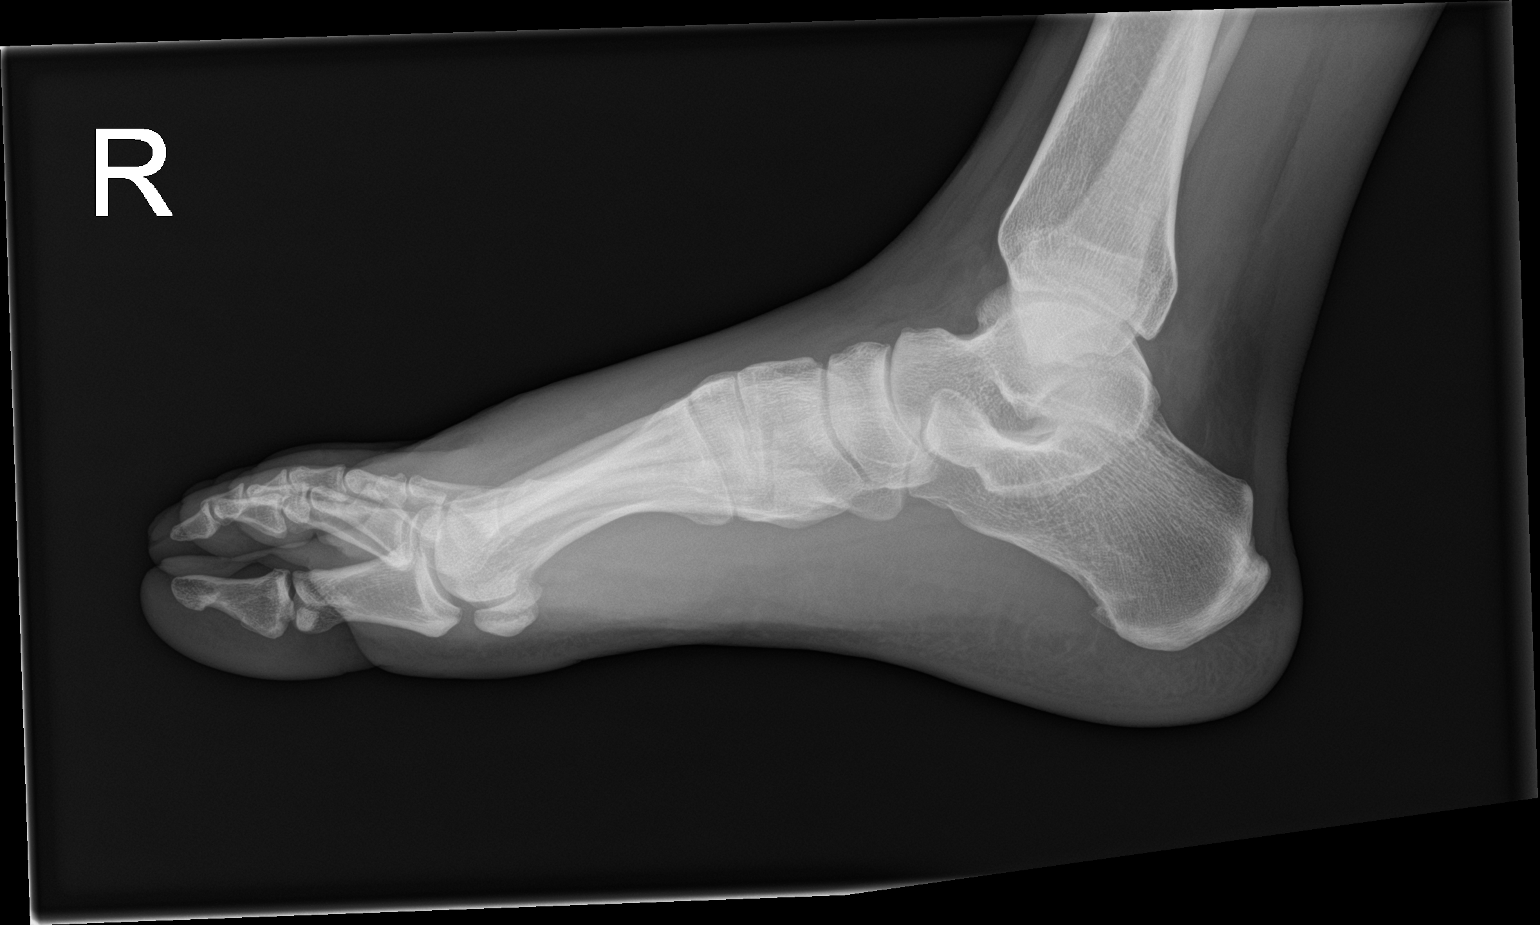

[3 of 3 positions shown; findings below may reference images not displayed]

FINDINGS: There is no evidence of fracture or dislocation. There is no
evidence of arthropathy or other focal bone abnormality. Soft
tissues are unremarkable. Small plantar calcaneal spur. No
radiopaque foreign body.
IMPRESSION: No acute osseous abnormality

## 2021-09-30 ENCOUNTER — Encounter (HOSPITAL_COMMUNITY): Payer: Self-pay | Admitting: Emergency Medicine

## 2021-09-30 ENCOUNTER — Emergency Department (HOSPITAL_COMMUNITY): Payer: 59 | Admitting: Registered Nurse

## 2021-09-30 ENCOUNTER — Emergency Department (HOSPITAL_COMMUNITY): Payer: 59

## 2021-09-30 ENCOUNTER — Other Ambulatory Visit: Payer: Self-pay

## 2021-09-30 ENCOUNTER — Ambulatory Visit (HOSPITAL_COMMUNITY)
Admission: EM | Admit: 2021-09-30 | Discharge: 2021-09-30 | Disposition: A | Discharge status: Home / Self Care | Payer: 59 | Attending: Emergency Medicine | Admitting: Emergency Medicine

## 2021-09-30 ENCOUNTER — Encounter (HOSPITAL_COMMUNITY)
Admission: EM | Disposition: A | Discharge status: Home / Self Care | Payer: Self-pay | Source: Home / Self Care | Attending: Emergency Medicine

## 2021-09-30 DIAGNOSIS — S02622A Fracture of subcondylar process of left mandible, initial encounter for closed fracture: Secondary | ICD-10-CM | POA: Diagnosis not present

## 2021-09-30 DIAGNOSIS — F17210 Nicotine dependence, cigarettes, uncomplicated: Secondary | ICD-10-CM | POA: Diagnosis not present

## 2021-09-30 DIAGNOSIS — S02601A Fracture of unspecified part of body of right mandible, initial encounter for closed fracture: Secondary | ICD-10-CM | POA: Diagnosis not present

## 2021-09-30 DIAGNOSIS — S02612A Fracture of condylar process of left mandible, initial encounter for closed fracture: Secondary | ICD-10-CM | POA: Diagnosis present

## 2021-09-30 HISTORY — PX: ORIF MANDIBULAR FRACTURE: SHX2127

## 2021-09-30 SURGERY — OPEN REDUCTION INTERNAL FIXATION (ORIF) MANDIBULAR FRACTURE
Anesthesia: General | Site: Mouth

## 2021-09-30 MED ORDER — KETOROLAC TROMETHAMINE 30 MG/ML IJ SOLN
INTRAMUSCULAR | Status: DC | PRN
  Administered 2021-09-30: 30 mg via INTRAVENOUS

## 2021-09-30 MED ORDER — ONDANSETRON HCL 4 MG/2ML IJ SOLN: 4.0000 mg | Freq: Four times a day (QID) | INTRAMUSCULAR | Status: DC | PRN

## 2021-09-30 MED ORDER — ONDANSETRON HCL 4 MG/2ML IJ SOLN
INTRAMUSCULAR | Status: AC
  Filled 2021-09-30: qty 2

## 2021-09-30 MED ORDER — LIDOCAINE-EPINEPHRINE 1 %-1:100000 IJ SOLN
INTRAMUSCULAR | Status: AC
  Filled 2021-09-30: qty 1

## 2021-09-30 MED ORDER — CLINDAMYCIN PALMITATE HCL 75 MG/5ML PO SOLR: 300.0000 mg | Freq: Three times a day (TID) | ORAL | 0 refills | Status: AC

## 2021-09-30 MED ORDER — LIDOCAINE 2% (20 MG/ML) 5 ML SYRINGE
INTRAMUSCULAR | Status: DC | PRN
Start: 2021-09-30 — End: 2021-09-30
  Administered 2021-09-30: 60 mg via INTRAVENOUS

## 2021-09-30 MED ORDER — DIPHENHYDRAMINE HCL 50 MG/ML IJ SOLN
INTRAMUSCULAR | Status: DC | PRN
  Administered 2021-09-30: 12.5 mg via INTRAVENOUS

## 2021-09-30 MED ORDER — ONDANSETRON HCL 4 MG/2ML IJ SOLN: 4.0000 mg | Freq: Once | INTRAMUSCULAR | Status: DC

## 2021-09-30 MED ORDER — SUCCINYLCHOLINE CHLORIDE 200 MG/10ML IV SOSY
PREFILLED_SYRINGE | INTRAVENOUS | Status: AC
  Filled 2021-09-30: qty 10

## 2021-09-30 MED ORDER — ROCURONIUM BROMIDE 10 MG/ML (PF) SYRINGE
PREFILLED_SYRINGE | INTRAVENOUS | Status: DC | PRN
Start: 2021-09-30 — End: 2021-09-30
  Administered 2021-09-30: 50 mg via INTRAVENOUS

## 2021-09-30 MED ORDER — FENTANYL CITRATE (PF) 100 MCG/2ML IJ SOLN
25.0000 ug | INTRAMUSCULAR | Status: DC | PRN
  Administered 2021-09-30 (×2): 50 ug via INTRAVENOUS

## 2021-09-30 MED ORDER — LIDOCAINE-EPINEPHRINE 1 %-1:100000 IJ SOLN
INTRAMUSCULAR | Status: DC | PRN
  Administered 2021-09-30: 6 mL

## 2021-09-30 MED ORDER — OXYCODONE HCL 5 MG PO TABS: 5.0000 mg | ORAL_TABLET | Freq: Once | ORAL | Status: DC | PRN

## 2021-09-30 MED ORDER — ONDANSETRON 4 MG PO TBDP
4.0000 mg | ORAL_TABLET | Freq: Once | ORAL | Status: AC
  Administered 2021-09-30: 4 mg via ORAL
  Filled 2021-09-30: qty 1

## 2021-09-30 MED ORDER — DEXMEDETOMIDINE (PRECEDEX) IN NS 20 MCG/5ML (4 MCG/ML) IV SYRINGE
PREFILLED_SYRINGE | INTRAVENOUS | Status: DC | PRN
  Administered 2021-09-30 (×2): 8 ug via INTRAVENOUS
  Administered 2021-09-30: 4 ug via INTRAVENOUS

## 2021-09-30 MED ORDER — DEXMEDETOMIDINE (PRECEDEX) IN NS 20 MCG/5ML (4 MCG/ML) IV SYRINGE
PREFILLED_SYRINGE | INTRAVENOUS | Status: AC
  Filled 2021-09-30: qty 5

## 2021-09-30 MED ORDER — ONDANSETRON HCL 4 MG/2ML IJ SOLN
INTRAMUSCULAR | Status: DC | PRN
  Administered 2021-09-30: 4 mg via INTRAVENOUS

## 2021-09-30 MED ORDER — MIDAZOLAM HCL 2 MG/2ML IJ SOLN
INTRAMUSCULAR | Status: AC
  Filled 2021-09-30: qty 2

## 2021-09-30 MED ORDER — PHENYLEPHRINE 40 MCG/ML (10ML) SYRINGE FOR IV PUSH (FOR BLOOD PRESSURE SUPPORT)
PREFILLED_SYRINGE | INTRAVENOUS | Status: DC | PRN
  Administered 2021-09-30: 40 ug via INTRAVENOUS

## 2021-09-30 MED ORDER — FENTANYL CITRATE (PF) 250 MCG/5ML IJ SOLN
INTRAMUSCULAR | Status: DC | PRN
  Administered 2021-09-30: 150 ug via INTRAVENOUS

## 2021-09-30 MED ORDER — HYDROCODONE-ACETAMINOPHEN 7.5-325 MG/15ML PO SOLN
15.0000 mL | Freq: Four times a day (QID) | ORAL | 0 refills | Status: DC | PRN
Start: 2021-09-30 — End: 2022-02-27

## 2021-09-30 MED ORDER — CHLORHEXIDINE GLUCONATE 0.12 % MT SOLN
OROMUCOSAL | Status: AC
  Administered 2021-09-30: 15 mL
  Filled 2021-09-30: qty 15

## 2021-09-30 MED ORDER — HYDROMORPHONE HCL 1 MG/ML IJ SOLN
1.0000 mg | Freq: Once | INTRAMUSCULAR | Status: AC
  Administered 2021-09-30: 1 mg via INTRAMUSCULAR
  Filled 2021-09-30: qty 1

## 2021-09-30 MED ORDER — KETOROLAC TROMETHAMINE 30 MG/ML IJ SOLN
INTRAMUSCULAR | Status: AC
  Filled 2021-09-30: qty 1

## 2021-09-30 MED ORDER — MIDAZOLAM HCL 2 MG/2ML IJ SOLN
INTRAMUSCULAR | Status: DC | PRN
Start: 2021-09-30 — End: 2021-09-30
  Administered 2021-09-30: 2 mg via INTRAVENOUS

## 2021-09-30 MED ORDER — SUGAMMADEX SODIUM 200 MG/2ML IV SOLN
INTRAVENOUS | Status: DC | PRN
Start: 2021-09-30 — End: 2021-09-30
  Administered 2021-09-30 (×2): 100 mg via INTRAVENOUS

## 2021-09-30 MED ORDER — CLINDAMYCIN PHOSPHATE 600 MG/50ML IV SOLN
600.0000 mg | Freq: Once | INTRAVENOUS | Status: AC
  Administered 2021-09-30: 600 mg via INTRAVENOUS

## 2021-09-30 MED ORDER — DEXAMETHASONE SODIUM PHOSPHATE 10 MG/ML IJ SOLN
INTRAMUSCULAR | Status: AC
  Filled 2021-09-30: qty 1

## 2021-09-30 MED ORDER — LIDOCAINE 2% (20 MG/ML) 5 ML SYRINGE
INTRAMUSCULAR | Status: AC
  Filled 2021-09-30: qty 5

## 2021-09-30 MED ORDER — FENTANYL CITRATE (PF) 250 MCG/5ML IJ SOLN
INTRAMUSCULAR | Status: AC
  Filled 2021-09-30: qty 5

## 2021-09-30 MED ORDER — DEXAMETHASONE SODIUM PHOSPHATE 10 MG/ML IJ SOLN
INTRAMUSCULAR | Status: DC | PRN
Start: 2021-09-30 — End: 2021-09-30
  Administered 2021-09-30: 5 mg via INTRAVENOUS

## 2021-09-30 MED ORDER — ROCURONIUM BROMIDE 10 MG/ML (PF) SYRINGE
PREFILLED_SYRINGE | INTRAVENOUS | Status: AC
  Filled 2021-09-30: qty 10

## 2021-09-30 MED ORDER — DIPHENHYDRAMINE HCL 50 MG/ML IJ SOLN
INTRAMUSCULAR | Status: AC
  Filled 2021-09-30: qty 1

## 2021-09-30 MED ORDER — CLINDAMYCIN PHOSPHATE 600 MG/50ML IV SOLN
INTRAVENOUS | Status: AC
  Filled 2021-09-30: qty 50

## 2021-09-30 MED ORDER — OXYMETAZOLINE HCL 0.05 % NA SOLN
NASAL | Status: AC
  Filled 2021-09-30: qty 30

## 2021-09-30 MED ORDER — HYDROMORPHONE HCL 1 MG/ML IJ SOLN: 0.5000 mg | Freq: Once | INTRAMUSCULAR | Status: DC

## 2021-09-30 MED ORDER — LACTATED RINGERS IV SOLN: INTRAVENOUS | Status: DC

## 2021-09-30 MED ORDER — PROPOFOL 10 MG/ML IV BOLUS
INTRAVENOUS | Status: AC
  Filled 2021-09-30: qty 20

## 2021-09-30 MED ORDER — SUCCINYLCHOLINE CHLORIDE 200 MG/10ML IV SOSY
PREFILLED_SYRINGE | INTRAVENOUS | Status: DC | PRN
Start: 2021-09-30 — End: 2021-09-30
  Administered 2021-09-30: 120 mg via INTRAVENOUS

## 2021-09-30 MED ORDER — OXYCODONE HCL 5 MG/5ML PO SOLN: 5.0000 mg | Freq: Once | ORAL | Status: DC | PRN

## 2021-09-30 MED ORDER — PHENYLEPHRINE 40 MCG/ML (10ML) SYRINGE FOR IV PUSH (FOR BLOOD PRESSURE SUPPORT)
PREFILLED_SYRINGE | INTRAVENOUS | Status: AC
  Filled 2021-09-30: qty 10

## 2021-09-30 MED ORDER — BACITRACIN ZINC 500 UNIT/GM EX OINT
TOPICAL_OINTMENT | CUTANEOUS | Status: AC
  Filled 2021-09-30: qty 28.35

## 2021-09-30 MED ORDER — PROPOFOL 10 MG/ML IV BOLUS
INTRAVENOUS | Status: DC | PRN
Start: 2021-09-30 — End: 2021-09-30
  Administered 2021-09-30: 200 mg via INTRAVENOUS

## 2021-09-30 MED ORDER — FENTANYL CITRATE (PF) 100 MCG/2ML IJ SOLN
INTRAMUSCULAR | Status: AC
  Filled 2021-09-30: qty 2

## 2021-09-30 SURGICAL SUPPLY — 44 items
BAG COUNTER SPONGE SURGICOUNT (BAG) ×2 IMPLANT
BAG SPNG CNTER NS LX DISP (BAG) ×1
BAR ARCH COIL WRE MTR ACHV (MISCELLANEOUS) ×2
BAR ARCH WIRE ENRICH COIL (MISCELLANEOUS) ×2 IMPLANT
BLADE CLIPPER SURG (BLADE) IMPLANT
BLADE SURG 15 STRL LF DISP TIS (BLADE) IMPLANT
BLADE SURG 15 STRL SS (BLADE)
CANISTER SUCT 3000ML PPV (MISCELLANEOUS) ×2 IMPLANT
CLEANER TIP ELECTROSURG 2X2 (MISCELLANEOUS) ×2 IMPLANT
COVER SURGICAL LIGHT HANDLE (MISCELLANEOUS) ×2 IMPLANT
DECANTER SPIKE VIAL GLASS SM (MISCELLANEOUS) ×2 IMPLANT
DRAPE HALF SHEET 40X57 (DRAPES) IMPLANT
ELECT COATED BLADE 2.86 ST (ELECTRODE) ×2 IMPLANT
ELECT REM PT RETURN 9FT ADLT (ELECTROSURGICAL) ×2
ELECTRODE REM PT RTRN 9FT ADLT (ELECTROSURGICAL) ×1 IMPLANT
GLOVE SURG ENC MOIS LTX SZ7.5 (GLOVE) ×2 IMPLANT
GOWN STRL REUS W/ TWL LRG LVL3 (GOWN DISPOSABLE) ×2 IMPLANT
GOWN STRL REUS W/TWL LRG LVL3 (GOWN DISPOSABLE) ×4
KIT BASIN OR (CUSTOM PROCEDURE TRAY) ×2 IMPLANT
KIT TURNOVER KIT B (KITS) ×2 IMPLANT
NDL HYPO 25GX1X1/2 BEV (NEEDLE) IMPLANT
NDL PRECISIONGLIDE 27X1.5 (NEEDLE) ×1 IMPLANT
NEEDLE HYPO 25GX1X1/2 BEV (NEEDLE) IMPLANT
NEEDLE PRECISIONGLIDE 27X1.5 (NEEDLE) ×2 IMPLANT
NS IRRIG 1000ML POUR BTL (IV SOLUTION) ×2 IMPLANT
PAD ARMBOARD 7.5X6 YLW CONV (MISCELLANEOUS) ×4 IMPLANT
PENCIL SMOKE EVACUATOR (MISCELLANEOUS) ×2 IMPLANT
POSITIONER HEAD DONUT 9IN (MISCELLANEOUS) IMPLANT
SCISSORS WIRE ANG 4 3/4 DISP (INSTRUMENTS) ×2 IMPLANT
SCREW MNDBLE 2.0X6 BONE (Screw) ×10 IMPLANT
SUT ETHILON 4 0 CL P 3 (SUTURE) IMPLANT
SUT MON AB 3-0 SH 27 (SUTURE) ×4
SUT MON AB 3-0 SH27 (SUTURE) ×2 IMPLANT
SUT PROLENE 6 0 PC 1 (SUTURE) IMPLANT
SUT STEEL 0 (SUTURE)
SUT STEEL 0 18XMFL TIE 17 (SUTURE) IMPLANT
SUT STEEL 1 (SUTURE) ×2 IMPLANT
SUT STEEL 2 (SUTURE) IMPLANT
SUT STEEL 4 (SUTURE) ×2 IMPLANT
SUT VICRYL 4-0 PS2 18IN ABS (SUTURE) IMPLANT
TOWEL GREEN STERILE FF (TOWEL DISPOSABLE) ×2 IMPLANT
TRAY ENT MC OR (CUSTOM PROCEDURE TRAY) ×2 IMPLANT
TRAY FOLEY MTR SLVR 14FR STAT (SET/KITS/TRAYS/PACK) IMPLANT
WATER STERILE IRR 1000ML POUR (IV SOLUTION) ×2 IMPLANT
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
## 2021-09-30 NOTE — Anesthesia Procedure Notes (Signed)
Procedure Name: Intubation Date/Time: 09/30/2021 6:56 PM Performed by: Laruth Bouchard., CRNA Pre-anesthesia Checklist: Patient identified, Emergency Drugs available, Suction available, Patient being monitored and Timeout performed Patient Re-evaluated:Patient Re-evaluated prior to induction Oxygen Delivery Method: Circle system utilized Preoxygenation: Pre-oxygenation with 100% oxygen Induction Type: IV induction, Rapid sequence and Cricoid Pressure applied Laryngoscope Size: Glidescope and 4 Grade View: Grade I Nasal Tubes: Right and Nasal prep performed Tube size: 7.5 mm Number of attempts: 1 Airway Equipment and Method: Video-laryngoscopy Placement Confirmation: ETT inserted through vocal cords under direct vision, positive ETCO2 and breath sounds checked- equal and bilateral Tube secured with: Tape Dental Injury: Teeth and Oropharynx as per pre-operative assessment

## 2021-09-30 NOTE — Anesthesia Preprocedure Evaluation (Signed)
Anesthesia Evaluation  Patient identified by MRN, date of birth, ID band Patient awake    Reviewed: Allergy & Precautions, H&P , NPO status , Patient's Chart, lab work & pertinent test results  Airway Mallampati: III   Neck ROM: full  Mouth opening: Limited Mouth Opening  Dental   Pulmonary Current Smoker,    breath sounds clear to auscultation       Cardiovascular negative cardio ROS   Rhythm:regular Rate:Normal     Neuro/Psych    GI/Hepatic   Endo/Other    Renal/GU      Musculoskeletal   Abdominal   Peds  Hematology   Anesthesia Other Findings   Reproductive/Obstetrics                             Anesthesia Physical Anesthesia Plan  ASA: 2  Anesthesia Plan: General   Post-op Pain Management:    Induction: Intravenous  PONV Risk Score and Plan: 1 and Ondansetron, Dexamethasone, Midazolam and Treatment may vary due to age or medical condition  Airway Management Planned: Nasal ETT  Additional Equipment:   Intra-op Plan:   Post-operative Plan: Extubation in OR  Informed Consent: I have reviewed the patients History and Physical, chart, labs and discussed the procedure including the risks, benefits and alternatives for the proposed anesthesia with the patient or authorized representative who has indicated his/her understanding and acceptance.     Dental advisory given  Plan Discussed with: CRNA, Anesthesiologist and Surgeon  Anesthesia Plan Comments:         Anesthesia Quick Evaluation

## 2021-09-30 NOTE — Op Note (Signed)
Preop diagnosis: Right body and left subcondylar mandible fractures Postop diagnosis: same Procedure: Maxillomandibular fixation Surgeon: Redmond Baseman Assist: None Anesth: General and local with 1% lidocaine with 1:100,000 epinephrine Compl: None Findings: Right body fracture posterior to first premolar, mobile.  When brought into normal occlusion, he has an anterior open bite. Description:  After discussing risks, benefits, and alternatives, the patient was brought to the operative suite and placed on the operative table in the supine position.  Anesthesia was induced and the patient was intubated via nasotracheal approach by the Anesthesia team without difficulty.  The eyes were taped closed and the bed was turned 90 degrees from anesthesia.  The lower face was prepped and draped in sterile fashion.  The upper and lower gingivobuccal sulcus was injected with local anesthetic.  The upper hybrid arch bar was cut to fit and placed with six 6 mm self-drilling screws.  The lower hybrid arch bar was likewise cut to fit and placed with four 6 mm screws while holding the teeth in normal occlusion with right-sided teeth normally positioned.  The arch bars were then fixated to one another using four wire loops.  The mouth wound was copiously irrigated with saline.  The throat was suctioned and he was returned to Anesthesia for wake-up.  He was extubated and moved to the recovery room in stable condition.
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
## 2021-09-30 NOTE — Progress Notes (Signed)
Pacu Discharge Note  Patient instuctions were given to Shawn-gf. Wound care, diet, pain, follow up care and how and whom to contact with concerns were discussed. Family aware someone needs to remain with patient overnight and concerns after receiving anesthesia and what to avoid and safety. Answered all questions and concerns.   Discussed liquid diet, needing to blend all food, increase calories, may use Ensure, Boost, Magic Cups, ok to use straws. Discussed ways to keep mouth clean and importance of keeping wire cutters with patient at all times for emergency.   Discharge paperwork has clear contact informations for surgeon and 24 hour RN line for concerns.   Discussed what concerns to look for including infection and signs/symptoms to look for.   IV was removed prior to discharge. Patient was brought to car with belongings.   Pt exits my care.

## 2021-09-30 NOTE — Progress Notes (Signed)
No labs per Dr. Redmond Baseman and Dr. Marin Comment.
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
## 2021-09-30 NOTE — Transfer of Care (Signed)
Immediate Anesthesia Transfer of Care Note  Patient: John [name]  Procedure(s) Performed: OPEN REDUCTION INTERNAL FIXATION (ORIF) MANDIBULAR FRACTURE (Mouth)  Patient Location: PACU  Anesthesia Type:General  Level of Consciousness: drowsy  Airway & Oxygen Therapy: Patient Spontanous Breathing and Patient connected to face mask oxygen  Post-op Assessment: Report given to RN and Post -op Vital signs reviewed and stable  Post vital signs: Reviewed and stable  Last Vitals:  Vitals Value Taken Time  BP 130/104 09/30/21 2029  Temp    Pulse 80 09/30/21 2030  Resp 14 09/30/21 2030  SpO2 100 % 09/30/21 2030  Vitals shown include unvalidated device data.  Last Pain:  Vitals:   09/30/21 1733  TempSrc: Axillary  PainSc:          Complications: No notable events documented.

## 2021-09-30 NOTE — Brief Op Note (Signed)
09/30/2021  8:16 PM  PATIENT:  John [name]  36 y.o. male  PRE-OPERATIVE DIAGNOSIS:  MANDIBULAR FRACTURE  POST-OPERATIVE DIAGNOSIS:  MANDIBULAR FRACTURE  PROCEDURE:  MAXILLOMANDIBULAR FIXATION  SURGEON:  Surgeon(s) and Role:    * Christia Reading, MD - Primary  PHYSICIAN ASSISTANT:   ASSISTANTS: none   ANESTHESIA:   general  EBL:  25 mL   BLOOD ADMINISTERED:none  DRAINS: none   LOCAL MEDICATIONS USED:  LIDOCAINE   SPECIMEN:  No Specimen  DISPOSITION OF SPECIMEN:  N/A  COUNTS:  YES  TOURNIQUET:  * No tourniquets in log *  DICTATION: .Note written in EPIC  PLAN OF CARE: Discharge to home after PACU  PATIENT DISPOSITION:  PACU - hemodynamically stable.   Delay start of Pharmacological VTE agent (>24hrs) due to surgical blood loss or risk of bleeding: no

## 2021-10-01 ENCOUNTER — Encounter (HOSPITAL_COMMUNITY): Payer: Self-pay | Admitting: Otolaryngology

## 2021-10-02 NOTE — Anesthesia Postprocedure Evaluation (Signed)
Anesthesia Post Note  Patient: John [name]  Procedure(s) Performed: OPEN REDUCTION INTERNAL FIXATION (ORIF) MANDIBULAR FRACTURE (Mouth)     Patient location during evaluation: PACU Anesthesia Type: General Level of consciousness: awake and alert Pain management: pain level controlled Vital Signs Assessment: post-procedure vital signs reviewed and stable Respiratory status: spontaneous breathing, nonlabored ventilation, respiratory function stable and patient connected to nasal cannula oxygen Cardiovascular status: blood pressure returned to baseline and stable Postop Assessment: no apparent nausea or vomiting Anesthetic complications: no   No notable events documented.  Last Vitals:  Vitals:   09/30/21 2135 09/30/21 2145  BP: (!) 121/95 (!) 124/94  Pulse: 77 74  Resp: 14 13  Temp:  36.6 C  SpO2: 94% 96%    Last Pain:  Vitals:   09/30/21 2145  TempSrc:   PainSc: 3                  Clessie Karras S

## 2021-11-01 ENCOUNTER — Encounter (HOSPITAL_COMMUNITY): Payer: Self-pay

## 2021-11-01 ENCOUNTER — Other Ambulatory Visit: Payer: Self-pay

## 2021-11-01 ENCOUNTER — Ambulatory Visit (HOSPITAL_COMMUNITY)
Admission: EM | Admit: 2021-11-01 | Discharge: 2021-11-01 | Disposition: A | Discharge status: Home / Self Care | Payer: Self-pay | Attending: Nurse Practitioner | Admitting: Nurse Practitioner

## 2021-11-01 DIAGNOSIS — Z202 Contact with and (suspected) exposure to infections with a predominantly sexual mode of transmission: Secondary | ICD-10-CM

## 2021-11-01 DIAGNOSIS — Z113 Encounter for screening for infections with a predominantly sexual mode of transmission: Secondary | ICD-10-CM | POA: Insufficient documentation

## 2021-11-01 NOTE — ED Triage Notes (Signed)
Pt reports his girlfriend tested positive for Herpes. States he wants STD screening. Denies outbreak.  ?

## 2021-11-01 NOTE — Discharge Instructions (Addendum)
We will let you know if any testing comes back positive.  Please use condoms with every sexual encounter. ?

## 2021-11-01 NOTE — ED Provider Notes (Addendum)
?[facility] ? ? ? ?CSN: [id number] ?Arrival date & time: 11/01/21  1207 ? ? ?  ? ?History   ?Chief Complaint ?Chief Complaint  ?Patient presents with  ? SEXUALLY TRANSMITTED DISEASE  ? ? ?HPI ?John [name] is a 36 y.o. male.  ? ?Patient reports he is here for "herpes "testing.  He reports his sexual partner tested positive.  He denies any rash, open sores or lesions, discharge from his penis.  He denies fevers, bodyaches, chills, nausea or vomiting. ? ?He also reports he has his jaw wired shut and is wondering if he can get a refill of his pain medication.  He has an appointment to see the surgeon next week. ? ? ?History reviewed. No pertinent past medical history. ? ?There are no problems to display for this patient. ? ? ?Past Surgical History:  ?Procedure Laterality Date  ? HERNIA REPAIR    ? ORIF MANDIBULAR FRACTURE N/A 09/30/2021  ? Procedure: OPEN REDUCTION INTERNAL FIXATION (ORIF) MANDIBULAR FRACTURE;  Surgeon: Christia Reading, MD;  Location: [facility] OR;  Service: ENT;  Laterality: N/A;  ? ? ? ? ? ?Home Medications   ? ?Prior to Admission medications   ?Medication Sig Start Date End Date Taking? Authorizing Provider  ?clotrimazole (LOTRIMIN) 1 % cream Apply to affected area 2 times daily ?Patient not taking: Reported on 09/30/2021 07/14/19   Fayrene Helper, PA-C  ?HYDROcodone-acetaminophen (HYCET) 7.5-325 mg/15 ml solution Take 15 mLs by mouth every 6 (six) hours as needed for moderate pain. 09/30/21 09/30/22  Christia Reading, MD  ?ibuprofen (ADVIL) 800 MG tablet Take 800 mg by mouth every 8 (eight) hours as needed for mild pain.    [provider]  ?oxyCODONE-acetaminophen (PERCOCET/ROXICET) 5-325 MG tablet Take 1 tablet by mouth every 4 (four) hours as needed for moderate pain.    [provider]  ? ? ?Family History ?History reviewed. No pertinent family history. ? ?Social History ?Social History  ? ?Tobacco Use  ? Smoking status: Every Day  ?  Types: Cigarettes  ? Smokeless tobacco: Never   ?Substance Use Topics  ? Alcohol use: Yes  ?  Comment: shots on wekend  ? Drug use: Yes  ?  Types: Marijuana  ?  Comment: few days ago  ? ? ? ?Allergies   ?Cauliflower [brassica oleracea] ? ? ?Review of Systems ?Review of Systems ?Per HPI ? ?Physical Exam ?Triage Vital Signs ?ED Triage Vitals  ?Enc Vitals Group  ?   BP 11/01/21 1411 116/80  ?   Pulse Rate 11/01/21 1411 87  ?   Resp 11/01/21 1411 17  ?   Temp 11/01/21 1411 98.4 ?F (36.9 ?C)  ?   Temp Source 11/01/21 1411 Oral  ?   SpO2 11/01/21 1411 97 %  ?   Weight --   ?   Height --   ?   Head Circumference --   ?   Peak Flow --   ?   Pain Score 11/01/21 1410 0  ?   Pain Loc --   ?   Pain Edu? --   ?   Excl. in GC? --   ? ?No data found. ? ?Updated Vital Signs ?BP 116/80 (BP Location: Right Arm)   Pulse 87   Temp 98.4 ?F (36.9 ?C) (Oral)   Resp 17   SpO2 97%  ? ?Visual Acuity ?Right Eye Distance:   ?Left Eye Distance:   ?Bilateral Distance:   ? ?Right Eye Near:   ?Left Eye Near:    ?  Bilateral Near:    ? ?Physical Exam ?Vitals and nursing note reviewed.  ?Constitutional:   ?   General: He is not in acute distress. ?   Appearance: Normal appearance. He is not toxic-appearing.  ?HENT:  ?   Head:  ?   Comments: Jaw wired shut  ?Pulmonary:  ?   Effort: Pulmonary effort is normal. No respiratory distress.  ?Skin: ?   General: Skin is warm and dry.  ?   Capillary Refill: Capillary refill takes less than 2 seconds.  ?   Coloration: Skin is not jaundiced or pale.  ?   Findings: No erythema.  ?Neurological:  ?   Mental Status: He is alert and oriented to person, place, and time.  ?   Motor: No weakness.  ?   Gait: Gait normal.  ?Psychiatric:     ?   Mood and Affect: Mood normal.     ?   Behavior: Behavior normal.     ?   Thought Content: Thought content normal.     ?   Judgment: Judgment normal.  ? ? ? ?UC Treatments / Results  ?Labs ?(all labs ordered are listed, but only abnormal results are displayed) ?Labs Reviewed  ?CYTOLOGY, (ORAL, ANAL, URETHRAL) ANCILLARY ONLY   ? ? ?EKG ? ? ?Radiology ?No results found. ? ?Procedures ?Procedures (including critical care time) ? ?Medications Ordered in UC ?Medications - No data to display ? ?Initial Impression / Assessment and Plan / UC Course  ?I have reviewed the triage vital signs and the nursing notes. ? ?Pertinent labs & imaging results that were available during my care of the patient were reviewed by me and considered in my medical decision making (see chart for details). ? ?  ?We will screen for STI with urethral cytology panel for gonorrhea, chlamydia, trichomonas.  Patient declines HIV and syphilis testing today, although I did encourage it.  I encouraged patient to use condoms with every sexual encounter.  We will notify patient with any positive findings.  We discussed that we do not routinely do blood testing for HSV and since he is not having any active rashes, sores, or lesions, testing cannot be performed today. ? ?I encouraged the patient to follow-up with his surgeon regarding a refill of pain medication. ?Final Clinical Impressions(s) / UC Diagnoses  ? ?Final diagnoses:  ?Screening for STD (sexually transmitted disease)  ? ? ? ?Discharge Instructions   ? ?  ?We will let you know if any testing comes back positive.  Please use condoms with every sexual encounter. ? ? ? ? ?ED Prescriptions   ?None ?  ? ?I have reviewed the PDMP during this encounter. ?  ?Valentino Nose, NP ?11/01/21 1454 ? ?  ?Valentino Nose, NP ?11/01/21 1457 ? ?

## 2021-11-02 LAB — CYTOLOGY, (ORAL, ANAL, URETHRAL) ANCILLARY ONLY
Chlamydia: NEGATIVE
Comment: NEGATIVE
Comment: NORMAL
Neisseria Gonorrhea: NEGATIVE

## 2021-12-13 ENCOUNTER — Ambulatory Visit: Payer: Self-pay | Admitting: Nurse Practitioner

## 2022-02-24 ENCOUNTER — Inpatient Hospital Stay (HOSPITAL_COMMUNITY)
Admission: EM | Admit: 2022-02-24 | Discharge: 2022-02-27 | DRG: 918 | Disposition: A | Discharge status: Home / Self Care | Payer: 59 | Attending: Internal Medicine | Admitting: Internal Medicine

## 2022-02-24 DIAGNOSIS — M6282 Rhabdomyolysis: Secondary | ICD-10-CM | POA: Diagnosis present

## 2022-02-24 DIAGNOSIS — F17210 Nicotine dependence, cigarettes, uncomplicated: Secondary | ICD-10-CM | POA: Diagnosis present

## 2022-02-24 DIAGNOSIS — R7309 Other abnormal glucose: Secondary | ICD-10-CM

## 2022-02-24 DIAGNOSIS — R030 Elevated blood-pressure reading, without diagnosis of hypertension: Secondary | ICD-10-CM | POA: Diagnosis present

## 2022-02-24 DIAGNOSIS — R7401 Elevation of levels of liver transaminase levels: Secondary | ICD-10-CM | POA: Diagnosis present

## 2022-02-24 DIAGNOSIS — R9431 Abnormal electrocardiogram [ECG] [EKG]: Secondary | ICD-10-CM | POA: Diagnosis present

## 2022-02-24 DIAGNOSIS — G2579 Other drug induced movement disorders: Secondary | ICD-10-CM

## 2022-02-24 DIAGNOSIS — R451 Restlessness and agitation: Secondary | ICD-10-CM | POA: Diagnosis present

## 2022-02-24 DIAGNOSIS — N179 Acute kidney failure, unspecified: Secondary | ICD-10-CM | POA: Diagnosis present

## 2022-02-24 DIAGNOSIS — F1210 Cannabis abuse, uncomplicated: Secondary | ICD-10-CM | POA: Diagnosis present

## 2022-02-24 DIAGNOSIS — F1910 Other psychoactive substance abuse, uncomplicated: Secondary | ICD-10-CM

## 2022-02-24 DIAGNOSIS — T43641A Poisoning by ecstasy, accidental (unintentional), initial encounter: Secondary | ICD-10-CM | POA: Diagnosis not present

## 2022-02-24 DIAGNOSIS — F1010 Alcohol abuse, uncomplicated: Secondary | ICD-10-CM | POA: Diagnosis present

## 2022-02-24 DIAGNOSIS — R000 Tachycardia, unspecified: Secondary | ICD-10-CM | POA: Diagnosis present

## 2022-02-24 DIAGNOSIS — E876 Hypokalemia: Secondary | ICD-10-CM | POA: Diagnosis not present

## 2022-02-24 DIAGNOSIS — I959 Hypotension, unspecified: Secondary | ICD-10-CM | POA: Diagnosis present

## 2022-02-24 DIAGNOSIS — R748 Abnormal levels of other serum enzymes: Secondary | ICD-10-CM

## 2022-02-24 DIAGNOSIS — T43644A Poisoning by ecstasy, undetermined, initial encounter: Principal | ICD-10-CM

## 2022-02-24 DIAGNOSIS — Z781 Physical restraint status: Secondary | ICD-10-CM

## 2022-02-24 DIAGNOSIS — R6510 Systemic inflammatory response syndrome (SIRS) of non-infectious origin without acute organ dysfunction: Secondary | ICD-10-CM | POA: Diagnosis present

## 2022-02-24 DIAGNOSIS — R410 Disorientation, unspecified: Secondary | ICD-10-CM

## 2022-02-24 DIAGNOSIS — D649 Anemia, unspecified: Secondary | ICD-10-CM | POA: Diagnosis present

## 2022-02-24 DIAGNOSIS — F1610 Hallucinogen abuse, uncomplicated: Secondary | ICD-10-CM | POA: Diagnosis present

## 2022-02-24 DIAGNOSIS — E872 Acidosis, unspecified: Secondary | ICD-10-CM | POA: Diagnosis present

## 2022-02-24 DIAGNOSIS — R509 Fever, unspecified: Secondary | ICD-10-CM | POA: Diagnosis present

## 2022-02-25 ENCOUNTER — Encounter (HOSPITAL_COMMUNITY): Payer: Self-pay | Admitting: Emergency Medicine

## 2022-02-25 ENCOUNTER — Emergency Department (HOSPITAL_COMMUNITY): Payer: 59

## 2022-02-25 ENCOUNTER — Other Ambulatory Visit: Payer: Self-pay

## 2022-02-25 ENCOUNTER — Observation Stay (HOSPITAL_COMMUNITY): Payer: 59

## 2022-02-25 DIAGNOSIS — R7401 Elevation of levels of liver transaminase levels: Secondary | ICD-10-CM

## 2022-02-25 DIAGNOSIS — F1010 Alcohol abuse, uncomplicated: Secondary | ICD-10-CM | POA: Diagnosis present

## 2022-02-25 DIAGNOSIS — F17210 Nicotine dependence, cigarettes, uncomplicated: Secondary | ICD-10-CM | POA: Diagnosis present

## 2022-02-25 DIAGNOSIS — F1610 Hallucinogen abuse, uncomplicated: Secondary | ICD-10-CM | POA: Diagnosis present

## 2022-02-25 DIAGNOSIS — R451 Restlessness and agitation: Secondary | ICD-10-CM | POA: Diagnosis present

## 2022-02-25 DIAGNOSIS — M6282 Rhabdomyolysis: Secondary | ICD-10-CM

## 2022-02-25 DIAGNOSIS — I959 Hypotension, unspecified: Secondary | ICD-10-CM | POA: Diagnosis present

## 2022-02-25 DIAGNOSIS — D649 Anemia, unspecified: Secondary | ICD-10-CM

## 2022-02-25 DIAGNOSIS — R030 Elevated blood-pressure reading, without diagnosis of hypertension: Secondary | ICD-10-CM | POA: Diagnosis present

## 2022-02-25 DIAGNOSIS — Z781 Physical restraint status: Secondary | ICD-10-CM | POA: Diagnosis not present

## 2022-02-25 DIAGNOSIS — E872 Acidosis, unspecified: Secondary | ICD-10-CM

## 2022-02-25 DIAGNOSIS — E876 Hypokalemia: Secondary | ICD-10-CM

## 2022-02-25 DIAGNOSIS — R6510 Systemic inflammatory response syndrome (SIRS) of non-infectious origin without acute organ dysfunction: Secondary | ICD-10-CM | POA: Diagnosis present

## 2022-02-25 DIAGNOSIS — R9431 Abnormal electrocardiogram [ECG] [EKG]: Secondary | ICD-10-CM | POA: Diagnosis present

## 2022-02-25 DIAGNOSIS — R509 Fever, unspecified: Secondary | ICD-10-CM | POA: Diagnosis present

## 2022-02-25 DIAGNOSIS — G2579 Other drug induced movement disorders: Secondary | ICD-10-CM

## 2022-02-25 DIAGNOSIS — T43641A Poisoning by ecstasy, accidental (unintentional), initial encounter: Secondary | ICD-10-CM | POA: Diagnosis present

## 2022-02-25 DIAGNOSIS — N179 Acute kidney failure, unspecified: Secondary | ICD-10-CM | POA: Diagnosis present

## 2022-02-25 DIAGNOSIS — F1210 Cannabis abuse, uncomplicated: Secondary | ICD-10-CM | POA: Diagnosis present

## 2022-02-25 DIAGNOSIS — R000 Tachycardia, unspecified: Secondary | ICD-10-CM | POA: Diagnosis present

## 2022-02-25 DIAGNOSIS — F1910 Other psychoactive substance abuse, uncomplicated: Secondary | ICD-10-CM

## 2022-02-25 LAB — URINALYSIS, ROUTINE W REFLEX MICROSCOPIC
Bacteria, UA: NONE SEEN
Bilirubin Urine: NEGATIVE
Glucose, UA: NEGATIVE mg/dL
Ketones, ur: 5 mg/dL — AB
Leukocytes,Ua: NEGATIVE
Nitrite: NEGATIVE
Protein, ur: 100 mg/dL — AB
Specific Gravity, Urine: 1.018 (ref 1.005–1.030)
pH: 5 (ref 5.0–8.0)

## 2022-02-25 LAB — CBC
HCT: 33.5 % — ABNORMAL LOW (ref 39.0–52.0)
Hemoglobin: 11.3 g/dL — ABNORMAL LOW (ref 13.0–17.0)
MCH: 31 pg (ref 26.0–34.0)
MCHC: 33.7 g/dL (ref 30.0–36.0)
MCV: 91.8 fL (ref 80.0–100.0)
Platelets: 267 10*3/uL (ref 150–400)
RBC: 3.65 MIL/uL — ABNORMAL LOW (ref 4.22–5.81)
RDW: 11.9 % (ref 11.5–15.5)
WBC: 8.8 10*3/uL (ref 4.0–10.5)
nRBC: 0 % (ref 0.0–0.2)

## 2022-02-25 LAB — COMPREHENSIVE METABOLIC PANEL
ALT: 24 U/L (ref 0–44)
ALT: 28 U/L (ref 0–44)
AST: 54 U/L — ABNORMAL HIGH (ref 15–41)
AST: 105 U/L — ABNORMAL HIGH (ref 15–41)
Albumin: 4.5 g/dL (ref 3.5–5.0)
Albumin: 3.5 g/dL (ref 3.5–5.0)
Alkaline Phosphatase: 74 U/L (ref 38–126)
Alkaline Phosphatase: 59 U/L (ref 38–126)
Anion gap: 28 — ABNORMAL HIGH (ref 5–15)
Anion gap: 10 (ref 5–15)
BUN: 14 mg/dL (ref 6–20)
BUN: 10 mg/dL (ref 6–20)
CO2: 12 mmol/L — ABNORMAL LOW (ref 22–32)
CO2: 22 mmol/L (ref 22–32)
Calcium: 9.6 mg/dL (ref 8.9–10.3)
Calcium: 8.6 mg/dL — ABNORMAL LOW (ref 8.9–10.3)
Chloride: 98 mmol/L (ref 98–111)
Chloride: 106 mmol/L (ref 98–111)
Creatinine, Ser: 1.53 mg/dL — ABNORMAL HIGH (ref 0.61–1.24)
Creatinine, Ser: 1 mg/dL (ref 0.61–1.24)
GFR, Estimated: 60 mL/min — ABNORMAL LOW (ref >60)
GFR, Estimated: >60 mL/min (ref >60)
Glucose, Bld: 196 mg/dL — ABNORMAL HIGH (ref 70–99)
Glucose, Bld: 87 mg/dL (ref 70–99)
Potassium: 3.1 mmol/L — ABNORMAL LOW (ref 3.5–5.1)
Potassium: 3.3 mmol/L — ABNORMAL LOW (ref 3.5–5.1)
Sodium: 138 mmol/L (ref 135–145)
Sodium: 138 mmol/L (ref 135–145)
Total Bilirubin: 0.8 mg/dL (ref 0.3–1.2)
Total Bilirubin: 1 mg/dL (ref 0.3–1.2)
Total Protein: 7.8 g/dL (ref 6.5–8.1)
Total Protein: 6.2 g/dL — ABNORMAL LOW (ref 6.5–8.1)

## 2022-02-25 LAB — PROCALCITONIN: Procalcitonin: 1.75 ng/mL

## 2022-02-25 LAB — CBC WITH DIFFERENTIAL/PLATELET
Abs Immature Granulocytes: 0.04 10*3/uL (ref 0.00–0.07)
Basophils Absolute: 0.1 10*3/uL (ref 0.0–0.1)
Basophils Relative: 1 %
Eosinophils Absolute: 0 10*3/uL (ref 0.0–0.5)
Eosinophils Relative: 1 %
HCT: 40.1 % (ref 39.0–52.0)
Hemoglobin: 12.2 g/dL — ABNORMAL LOW (ref 13.0–17.0)
Immature Granulocytes: 1 %
Lymphocytes Relative: 23 %
Lymphs Abs: 1.9 10*3/uL (ref 0.7–4.0)
MCH: 30 pg (ref 26.0–34.0)
MCHC: 30.4 g/dL (ref 30.0–36.0)
MCV: 98.5 fL (ref 80.0–100.0)
Monocytes Absolute: 1 10*3/uL (ref 0.1–1.0)
Monocytes Relative: 11 %
Neutro Abs: 5.5 10*3/uL (ref 1.7–7.7)
Neutrophils Relative %: 63 %
Platelets: 409 10*3/uL — ABNORMAL HIGH (ref 150–400)
RBC: 4.07 MIL/uL — ABNORMAL LOW (ref 4.22–5.81)
RDW: 12.1 % (ref 11.5–15.5)
WBC: 8.5 10*3/uL (ref 4.0–10.5)
nRBC: 0 % (ref 0.0–0.2)

## 2022-02-25 LAB — APTT: aPTT: 30 seconds (ref 24–36)

## 2022-02-25 LAB — CK
Total CK: 848 U/L — ABNORMAL HIGH (ref 49–397)
Total CK: 6829 U/L — ABNORMAL HIGH (ref 49–397)
Total CK: 5620 U/L — ABNORMAL HIGH (ref 49–397)

## 2022-02-25 LAB — LACTIC ACID, PLASMA
Lactic Acid, Venous: >9 mmol/L (ref 0.5–1.9)
Lactic Acid, Venous: 2.5 mmol/L (ref 0.5–1.9)
Lactic Acid, Venous: 1.1 mmol/L (ref 0.5–1.9)

## 2022-02-25 LAB — PROTIME-INR
INR: 1 (ref 0.8–1.2)
Prothrombin Time: 13.4 seconds (ref 11.4–15.2)

## 2022-02-25 LAB — MAGNESIUM: Magnesium: 2.3 mg/dL (ref 1.7–2.4)

## 2022-02-25 LAB — HIV ANTIBODY (ROUTINE TESTING W REFLEX): HIV Screen 4th Generation wRfx: NONREACTIVE

## 2022-02-25 LAB — RAPID URINE DRUG SCREEN, HOSP PERFORMED
Amphetamines: POSITIVE — AB
Barbiturates: NOT DETECTED
Benzodiazepines: NOT DETECTED
Cocaine: NOT DETECTED
Opiates: NOT DETECTED
Tetrahydrocannabinol: POSITIVE — AB

## 2022-02-25 LAB — ETHANOL: Alcohol, Ethyl (B): 11 mg/dL — ABNORMAL HIGH (ref <10)

## 2022-02-25 MED ORDER — TRAZODONE HCL 50 MG PO TABS: 50.0000 mg | ORAL_TABLET | Freq: Every evening | ORAL | Status: DC | PRN

## 2022-02-25 MED ORDER — SODIUM CHLORIDE 0.9 % IV SOLN: INTRAVENOUS | Status: DC

## 2022-02-25 MED ORDER — METOPROLOL TARTRATE 5 MG/5ML IV SOLN: 5.0000 mg | INTRAVENOUS | Status: DC | PRN

## 2022-02-25 MED ORDER — LACTATED RINGERS IV BOLUS (SEPSIS)
1000.0000 mL | Freq: Once | INTRAVENOUS | Status: AC
Start: 2022-02-25 — End: 2022-02-25
  Administered 2022-02-25: 1000 mL via INTRAVENOUS

## 2022-02-25 MED ORDER — ACETAMINOPHEN 650 MG RE SUPP
650.0000 mg | Freq: Once | RECTAL | Status: AC
  Administered 2022-02-25: 650 mg via RECTAL
  Filled 2022-02-25: qty 1

## 2022-02-25 MED ORDER — LACTATED RINGERS IV SOLN: INTRAVENOUS | Status: DC

## 2022-02-25 MED ORDER — IPRATROPIUM-ALBUTEROL 0.5-2.5 (3) MG/3ML IN SOLN: 3.0000 mL | RESPIRATORY_TRACT | Status: DC | PRN

## 2022-02-25 MED ORDER — LACTATED RINGERS IV BOLUS
1000.0000 mL | Freq: Once | INTRAVENOUS | Status: AC
  Administered 2022-02-25: 1000 mL via INTRAVENOUS

## 2022-02-25 MED ORDER — GUAIFENESIN 100 MG/5ML PO LIQD: 5.0000 mL | ORAL | Status: DC | PRN

## 2022-02-25 MED ORDER — HYDRALAZINE HCL 20 MG/ML IJ SOLN: 10.0000 mg | INTRAMUSCULAR | Status: DC | PRN

## 2022-02-25 MED ORDER — THIAMINE HCL 100 MG/ML IJ SOLN: 100.0000 mg | Freq: Every day | INTRAMUSCULAR | Status: DC

## 2022-02-25 MED ORDER — POTASSIUM CHLORIDE 10 MEQ/100ML IV SOLN
10.0000 meq | INTRAVENOUS | Status: AC
  Administered 2022-02-25 (×3): 10 meq via INTRAVENOUS
  Filled 2022-02-25 (×3): qty 100

## 2022-02-25 MED ORDER — POTASSIUM CHLORIDE CRYS ER 20 MEQ PO TBCR
40.0000 meq | EXTENDED_RELEASE_TABLET | Freq: Once | ORAL | Status: AC
Start: 2022-02-25 — End: 2022-02-25
  Administered 2022-02-25: 40 meq via ORAL
  Filled 2022-02-25: qty 2

## 2022-02-25 MED ORDER — VANCOMYCIN HCL 1250 MG/250ML IV SOLN
1250.0000 mg | Freq: Once | INTRAVENOUS | Status: AC
Start: 2022-02-25 — End: 2022-02-25
  Administered 2022-02-25: 1250 mg via INTRAVENOUS
  Filled 2022-02-25: qty 250

## 2022-02-25 MED ORDER — METRONIDAZOLE 500 MG/100ML IV SOLN
500.0000 mg | Freq: Once | INTRAVENOUS | Status: AC
  Administered 2022-02-25: 500 mg via INTRAVENOUS
  Filled 2022-02-25: qty 100

## 2022-02-25 MED ORDER — LACTATED RINGERS IV BOLUS (SEPSIS)
1000.0000 mL | Freq: Once | INTRAVENOUS | Status: AC
  Administered 2022-02-25: 1000 mL via INTRAVENOUS

## 2022-02-25 MED ORDER — SENNOSIDES-DOCUSATE SODIUM 8.6-50 MG PO TABS: 1.0000 | ORAL_TABLET | Freq: Every evening | ORAL | Status: DC | PRN

## 2022-02-25 MED ORDER — ZIPRASIDONE MESYLATE 20 MG IM SOLR
20.0000 mg | Freq: Once | INTRAMUSCULAR | Status: AC
Start: 2022-02-25 — End: 2022-02-25

## 2022-02-25 MED ORDER — ADULT MULTIVITAMIN W/MINERALS CH
1.0000 | ORAL_TABLET | Freq: Every day | ORAL | Status: DC
  Administered 2022-02-25 – 2022-02-27 (×3): 1 via ORAL
  Filled 2022-02-25 (×3): qty 1

## 2022-02-25 MED ORDER — ACETAMINOPHEN 650 MG RE SUPP: 650.0000 mg | Freq: Four times a day (QID) | RECTAL | Status: DC | PRN

## 2022-02-25 MED ORDER — LORAZEPAM 2 MG/ML IJ SOLN: 1.0000 mg | INTRAMUSCULAR | Status: DC | PRN

## 2022-02-25 MED ORDER — FOLIC ACID 1 MG PO TABS
1.0000 mg | ORAL_TABLET | Freq: Every day | ORAL | Status: DC
  Administered 2022-02-25 – 2022-02-27 (×3): 1 mg via ORAL
  Filled 2022-02-25 (×3): qty 1

## 2022-02-25 MED ORDER — ACETAMINOPHEN 325 MG PO TABS: 650.0000 mg | ORAL_TABLET | Freq: Four times a day (QID) | ORAL | Status: DC | PRN

## 2022-02-25 MED ORDER — OXYCODONE HCL 5 MG PO TABS: 5.0000 mg | ORAL_TABLET | ORAL | Status: DC | PRN

## 2022-02-25 MED ORDER — THIAMINE HCL 100 MG PO TABS
100.0000 mg | ORAL_TABLET | Freq: Every day | ORAL | Status: DC
  Administered 2022-02-25 – 2022-02-27 (×3): 100 mg via ORAL
  Filled 2022-02-25 (×3): qty 1

## 2022-02-25 MED ORDER — ZIPRASIDONE MESYLATE 20 MG IM SOLR
INTRAMUSCULAR | Status: AC
  Administered 2022-02-25: 20 mg via INTRAMUSCULAR
  Filled 2022-02-25: qty 20

## 2022-02-25 MED ORDER — CEFEPIME HCL 2 G IV SOLR
2.0000 g | Freq: Once | INTRAVENOUS | Status: AC
  Administered 2022-02-25: 2 g via INTRAVENOUS
  Filled 2022-02-25: qty 12.5

## 2022-02-25 MED ORDER — VANCOMYCIN HCL IN DEXTROSE 1-5 GM/200ML-% IV SOLN: 1000.0000 mg | Freq: Once | INTRAVENOUS | Status: DC

## 2022-02-25 MED ORDER — LORAZEPAM 1 MG PO TABS: 1.0000 mg | ORAL_TABLET | ORAL | Status: DC | PRN

## 2022-02-25 NOTE — TOC Initial Note (Signed)
Transition of Care [facility]) - Initial/Assessment Note    Patient Details  Name: John [name] MRN: [id number] Date of Birth: 1986/07/06  Transition [facility]) CM/SW Contact:    Kermit Balo, RN Phone Number: 02/25/2022, 1:14 PM  Clinical Narrative:                 Patient is from home but has gone through a recent break up and will be staying at his brothers home.  Pt denies any issues with transportation. He was not on any prescribed medications prior to admission.  Pt doesn't have a PCP. He asked that CM assist him in finding one. CM will place information on the AVS.  Pt will have transport home when discharged.   Expected Discharge Plan: Home/Self Care Barriers to Discharge: Continued Medical Work up   Patient Goals and CMS Choice        Expected Discharge Plan and Services Expected Discharge Plan: Home/Self Care   Discharge Planning Services: CM Consult   Living arrangements for the past 2 months: Homeless                                      Prior Living Arrangements/Services Living arrangements for the past 2 months: Homeless Lives with:: Siblings Patient language and need for interpreter reviewed:: Yes Do you feel safe going back to the place where you live?: Yes        Care giver support system in place?: No (comment)   Criminal Activity/Legal Involvement Pertinent to Current Situation/Hospitalization: No - Comment as needed  Activities of Daily Living Home Assistive Devices/Equipment: None ADL Screening (condition at time of admission) Patient's cognitive ability adequate to safely complete daily activities?: No Is the patient deaf or have difficulty hearing?: No Does the patient have difficulty seeing, even when wearing glasses/contacts?: No Does the patient have difficulty concentrating, remembering, or making decisions?: No Patient able to express need for assistance with ADLs?: No Does the patient have difficulty dressing or bathing?:  No Independently performs ADLs?: Yes (appropriate for developmental age) Does the patient have difficulty walking or climbing stairs?: No Weakness of Legs: None Weakness of Arms/Hands: None  Permission Sought/Granted                  Emotional Assessment Appearance:: Appears stated age Attitude/Demeanor/Rapport: Engaged Affect (typically observed): Accepting Orientation: : Oriented to Self, Oriented to Place, Oriented to  Time, Oriented to Situation Alcohol / Substance Use: Illicit Drugs Psych Involvement: No (comment)  Admission diagnosis:  Hypokalemia [E87.6] Lactic acidosis [E87.20] Delirium [R41.0] Elevated CK [R74.8] Acute kidney injury (nontraumatic) (HCC) [N17.9] 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] Normochromic normocytic anemia [D64.9] Elevated random blood glucose level [R73.09] Overdose of methylenedioxymethamphetamine (MDMA) of undetermined intent (HCC) [O53.664Q] Patient Active Problem List   Diagnosis Date Noted   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   Serotonin syndrome 02/25/2022   Polysubstance abuse (HCC) 02/25/2022   Rhabdomyolysis 02/25/2022   AKI (acute kidney injury) (HCC) 02/25/2022   Hypokalemia 02/25/2022   QT prolongation 02/25/2022   Lactic acidosis 02/25/2022   Elevated transaminase level 02/25/2022   Alcohol abuse 02/25/2022   Normocytic anemia 02/25/2022   PCP:  Patient, No Pcp Per Pharmacy:   [id number] [place], [place] - [address] AT [facility] OF [address] [place] [address] Phone: [phone] Fax: [phone]  Social Determinants of Health (SDOH) Interventions    Readmission Risk Interventions     No data to display

## 2022-02-25 NOTE — ED Provider Notes (Signed)
[facility] EMERGENCY DEPARTMENT Provider Note   CSN: [id number] Arrival date & time: 02/24/22  2358     History  Chief complaint: Altered mental status.  John [name] is a 36 y.o. male.  The history is provided by a friend. The history is limited by the condition of the patient (Altered mental status).  He has no known significant past history and comes in extremely agitated.  He reportedly used some ecstasy at about 5 PM.  At about 11 PM, he became agitated and started grabbing at his penis claiming that there was something in it.  He is not able to give any reliable history.   Home Medications Prior to Admission medications   Medication Sig Start Date End Date Taking? Authorizing Provider  clotrimazole (LOTRIMIN) 1 % cream Apply to affected area 2 times daily Patient not taking: Reported on 09/30/2021 07/14/19   Fayrene Helper, PA-C  HYDROcodone-acetaminophen (HYCET) 7.5-325 mg/15 ml solution Take 15 mLs by mouth every 6 (six) hours as needed for moderate pain. 09/30/21 09/30/22  Christia Reading, MD  ibuprofen (ADVIL) 800 MG tablet Take 800 mg by mouth every 8 (eight) hours as needed for mild pain.    [provider]  oxyCODONE-acetaminophen (PERCOCET/ROXICET) 5-325 MG tablet Take 1 tablet by mouth every 4 (four) hours as needed for moderate pain.    [provider]      Allergies    Cauliflower [place] oleracea]    Review of Systems   Review of Systems  Unable to perform ROS: Mental status change    Physical Exam Updated Vital Signs Pulse (!) 165   Temp (!) 102.9 F (39.4 C) (Axillary)   Resp (!) 21   SpO2 100%  Physical Exam Vitals and nursing note reviewed.   36 year old male, diaphoretic, agitated, thrashing about on the bed. Vital signs are significant for elevated temperature, heart rate, respiratory rate. Oxygen saturation is 97%, which is normal. Head is normocephalic and atraumatic. PERRLA, EOMI. Oropharynx is clear. Neck is  nontender and supple without adenopathy or JVD. Back is nontender and there is no CVA tenderness. Lungs are clear without rales, wheezes, or rhonchi. Chest is nontender. Heart is tachycardic without murmur. Abdomen is soft, flat, nontender. Genitalia: Circumcised penis without obvious lesions. Extremities have no cyanosis or edema, full range of motion is present. Skin is warm and dry without rash. Neurologic: Agitated, thrashing about.  Cranial nerves are grossly intact.  Moves all extremities equally.  Some muscle fasciculation is noted.  ED Results / Procedures / Treatments   Labs (all labs ordered are listed, but only abnormal results are displayed) Labs Reviewed  CULTURE, BLOOD (ROUTINE X 2)  CULTURE, BLOOD (ROUTINE X 2)  URINE CULTURE  LACTIC ACID, PLASMA  LACTIC ACID, PLASMA  COMPREHENSIVE METABOLIC PANEL  CBC WITH DIFFERENTIAL/PLATELET  PROTIME-INR  APTT  URINALYSIS, ROUTINE W REFLEX MICROSCOPIC  RAPID URINE DRUG SCREEN, HOSP PERFORMED  ETHANOL    EKG EKG Interpretation  Date/Time:  Monday February 25 2022 00:11:20 EDT Ventricular Rate:  177 PR Interval:  141 QRS Duration: 92 QT Interval:  312 QTC Calculation: 536 R Axis:   74 Text Interpretation: Sinus tachycardia Consider right atrial enlargement ST depression, probably rate related Prolonged QT interval Artifact in lead(s) I II III aVR aVL aVF V1 V3 V4 V5 No old tracing to compare Confirmed by Dione Booze ([id number]) on 02/25/2022 12:23:27 AM  Radiology No results found.  Procedures Procedures  Cardiac monitor shows sinus tachycardia, per  my interpretation.  Medications Ordered in ED Medications  acetaminophen (TYLENOL) suppository 650 mg (has no administration in time range)  lactated ringers bolus 1,000 mL (has no administration in time range)  ziprasidone (GEODON) injection 20 mg (20 mg Intramuscular Given 02/25/22 0018)    ED Course/ Medical Decision Making/ A&P                           Medical  Decision Making Amount and/or Complexity of Data Reviewed Labs: ordered. Radiology: ordered.  Risk OTC drugs. Prescription drug management. Decision regarding hospitalization.   Agitated delirium which is most likely secondary to drug use with reported use of ecstasy.  However, with fever and altered mental status, he is also started on the evolving sepsis pathway.  I have ordered ziprasidone for sedation, IV fluids.  I have ordered laboratory work-up including CBC, comprehensive metabolic panel, CK, ethanol level, urine drug screen.  I have reviewed and interpreted the ECG, and my interpretation is sinus tachycardia, nonspecific ST changes which are likely rate related, prolonged QT interval.  Because of prolonged QT interval, I have ordered a magnesium level.  Old records are reviewed, and it is noted that he had an ED visit on 06/07/2019 at which time he had an elevated CK of 1595, drug screen not done at that time.  I have consulted with poison control who recommend aggressive hydration, cooling measures, benzodiazepines.  After ziprasidone, patient is now calm and sleeping but maintaining adequate oxygen saturation and blood pressure.  Heart rate has come down to 117.  Patient reevaluated, continues to be sedated and continues to maintain adequate oxygen saturation and blood pressure.  Heart rate has come down to 94 and he is now afebrile.  I have reviewed and interpreted all of his laboratory tests and my interpretation is lactic acidosis, elevated total CK, acute kidney injury with creatinine 1.53 compared with 1.09 on 06/08/2019, hypokalemia, mild anemia and thrombocytosis.  Initial lactate was markedly elevated at greater than 9.0, repeat was still elevated at 2.5.  Because of markedly elevated lactic acid level in the setting of tachycardia and altered mental status, he was started on empiric antibiotics.  However, I do feel it is most likely that all of the findings are secondary to MDMA  overdose.  Case is discussed with Dr. Loney Loh of [facility], who agrees to admit the patient.  CRITICAL CARE Performed by: Dione Booze Total critical care time: 165 minutes Critical care time was exclusive of separately billable procedures and treating other patients. Critical care was necessary to treat or prevent imminent or life-threatening deterioration. Critical care was time spent personally by me on the following activities: development of treatment plan with patient and/or surrogate as well as nursing, discussions with consultants, evaluation of patient's response to treatment, examination of patient, obtaining history from patient or surrogate, ordering and performing treatments and interventions, ordering and review of laboratory studies, ordering and review of radiographic studies, pulse oximetry and re-evaluation of patient's condition.  Final Clinical Impression(s) / ED Diagnoses Final diagnoses:  Overdose of methylenedioxymethamphetamine (MDMA) of undetermined intent (HCC)  Delirium  Lactic acidosis  Acute kidney injury (nontraumatic) (HCC)  Hypokalemia  Elevated random blood glucose level  Normochromic normocytic anemia  Elevated CK    Rx / DC Orders ED Discharge Orders     None         Dione Booze, MD 02/25/22 [phone]

## 2022-02-25 NOTE — ED Notes (Signed)
Dr. Preston Fleeting informed of critical lactic acid value >9.0. Plan for IV antibiotics and additional fluids

## 2022-02-26 DIAGNOSIS — F1610 Hallucinogen abuse, uncomplicated: Secondary | ICD-10-CM | POA: Diagnosis not present

## 2022-02-26 LAB — URINE CULTURE: Culture: NO GROWTH

## 2022-02-26 LAB — COMPREHENSIVE METABOLIC PANEL
ALT: 41 U/L (ref 0–44)
AST: 193 U/L — ABNORMAL HIGH (ref 15–41)
Albumin: 3.4 g/dL — ABNORMAL LOW (ref 3.5–5.0)
Alkaline Phosphatase: 56 U/L (ref 38–126)
Anion gap: 8 (ref 5–15)
BUN: 14 mg/dL (ref 6–20)
CO2: 24 mmol/L (ref 22–32)
Calcium: 8.7 mg/dL — ABNORMAL LOW (ref 8.9–10.3)
Chloride: 106 mmol/L (ref 98–111)
Creatinine, Ser: 0.96 mg/dL (ref 0.61–1.24)
GFR, Estimated: >60 mL/min (ref >60)
Glucose, Bld: 105 mg/dL — ABNORMAL HIGH (ref 70–99)
Potassium: 3.5 mmol/L (ref 3.5–5.1)
Sodium: 138 mmol/L (ref 135–145)
Total Bilirubin: 1 mg/dL (ref 0.3–1.2)
Total Protein: 6.2 g/dL — ABNORMAL LOW (ref 6.5–8.1)

## 2022-02-26 LAB — CK
Total CK: 11250 U/L — ABNORMAL HIGH (ref 49–397)
Total CK: 8908 U/L — ABNORMAL HIGH (ref 49–397)

## 2022-02-26 LAB — MAGNESIUM: Magnesium: 1.8 mg/dL (ref 1.7–2.4)

## 2022-02-26 MED ORDER — SODIUM CHLORIDE 0.9 % IV SOLN: INTRAVENOUS | Status: AC

## 2022-02-26 MED ORDER — BACITRACIN-NEOMYCIN-POLYMYXIN OINTMENT TUBE
TOPICAL_OINTMENT | Freq: Two times a day (BID) | CUTANEOUS | Status: DC
  Filled 2022-02-26: qty 14

## 2022-02-26 MED ORDER — POTASSIUM CHLORIDE CRYS ER 20 MEQ PO TBCR
40.0000 meq | EXTENDED_RELEASE_TABLET | Freq: Once | ORAL | Status: AC
  Administered 2022-02-26: 40 meq via ORAL
  Filled 2022-02-26: qty 2

## 2022-02-27 DIAGNOSIS — F1010 Alcohol abuse, uncomplicated: Secondary | ICD-10-CM | POA: Diagnosis not present

## 2022-02-27 DIAGNOSIS — N179 Acute kidney failure, unspecified: Secondary | ICD-10-CM | POA: Diagnosis not present

## 2022-02-27 DIAGNOSIS — F1610 Hallucinogen abuse, uncomplicated: Secondary | ICD-10-CM | POA: Diagnosis not present

## 2022-02-27 LAB — COMPREHENSIVE METABOLIC PANEL
ALT: 39 U/L (ref 0–44)
AST: 134 U/L — ABNORMAL HIGH (ref 15–41)
Albumin: 3.1 g/dL — ABNORMAL LOW (ref 3.5–5.0)
Alkaline Phosphatase: 72 U/L (ref 38–126)
Anion gap: 11 (ref 5–15)
BUN: 9 mg/dL (ref 6–20)
CO2: 22 mmol/L (ref 22–32)
Calcium: 9 mg/dL (ref 8.9–10.3)
Chloride: 106 mmol/L (ref 98–111)
Creatinine, Ser: 0.8 mg/dL (ref 0.61–1.24)
GFR, Estimated: >60 mL/min (ref >60)
Glucose, Bld: 116 mg/dL — ABNORMAL HIGH (ref 70–99)
Potassium: 3.8 mmol/L (ref 3.5–5.1)
Sodium: 139 mmol/L (ref 135–145)
Total Bilirubin: 0.6 mg/dL (ref 0.3–1.2)
Total Protein: 5.8 g/dL — ABNORMAL LOW (ref 6.5–8.1)

## 2022-02-27 LAB — MAGNESIUM: Magnesium: 1.8 mg/dL (ref 1.7–2.4)

## 2022-02-27 LAB — CK: Total CK: 7197 U/L — ABNORMAL HIGH (ref 49–397)

## 2022-02-27 MED ORDER — FOLIC ACID 1 MG PO TABS: 1.0000 mg | ORAL_TABLET | Freq: Every day | ORAL | 1 refills | Status: DC

## 2022-02-27 MED ORDER — TRIPLE ANTIBIOTIC 3.5-400-5000 EX OINT: 1.0000 | TOPICAL_OINTMENT | Freq: Two times a day (BID) | CUTANEOUS | 0 refills | Status: DC

## 2022-02-27 MED ORDER — THIAMINE HCL 100 MG PO TABS: 100.0000 mg | ORAL_TABLET | Freq: Every day | ORAL | 1 refills | Status: DC

## 2022-02-27 NOTE — Discharge Summary (Signed)
Physician Discharge Summary   Patient: John [name] MRN: [id number] DOB: [birth year]  Admit date:     02/24/2022  Discharge date: 02/27/22  Discharge Physician: Thad Ranger, MD    PCP: Patient, No Pcp Per   Recommendations at discharge:   Patient recommended to drink plenty of fluids.  Stay hydrated.  Avoid strenuous exercise, exertion or ecstasy.  Discharge Diagnoses:    MDMA abuse (HCC)   Serotonin syndrome   Polysubstance abuse (HCC)   Rhabdomyolysis   AKI (acute kidney injury) (HCC)   Hypokalemia   QT prolongation   Lactic acidosis   Elevated transaminase level   Alcohol abuse   Normocytic anemia   Hospital Course: 36 year old with history of polysubstance abuse came to the ED for agitation after using ecstasy around 5 PM.  Around 11 PM patient became agitated started grabbing his penis claiming that there was something in the night.  Upon admission he was febrile, tachycardic with elevated lactate.  UDS was positive for amphetamine and THC.  Poison control was notified.  In the beginning he was given ziprasidone for severe agitation.  Due to severe rhabdomyolysis patient received aggressive IV fluids.  Assessment and Plan:  Serotonin syndrome secondary to MDMA (ecstasy) intoxication/overdose Polysubstance abuse Metabolic acidosis, improved -Patient presented with severe agitation, fever, tachycardia and hypotension secondary to MDMA, THC use. -UDS positive for amphetamine, THC.   -Initially ziprasidone  was given in ED which helped his agitation. -Poison control was also notified.  CT head was negative, no obvious source of infection. -Currently resolved, patient appears back to his baseline.  Mild rhabdomyolysis with lactic acidosis. -Patient was placed on aggressive IV fluid hydration.  Initially lowered, CK started trending up to 11,250, now trending down. -Walking around, does not feel any dizziness or soreness.  Tolerating diet.  Wants to go home ASAP. -Patient  was recommended continue hydration, drink plenty of fluids, avoid strenuous exercise or exertion.  Avoid MDMA or THC.      AKI, resolved Creatinine 1.5 on admission, improved to 0.8 at the time of discharge   Mild hypokalemia QT prolongation - Resolved, potassium 3.8   Severe lactic acidosis, rhabdomyolysis - Resolved with IV fluid hydration   Mild elevation of transaminase level - AST improving  -Will need to follow-up with outpatient PCP, appointment scheduled improved.      SIRS - Likely due to #1, no evidence of infection.  Improved.   Alcohol abuse -Currently not in any acute withdrawals. -Continue thiamine, folate   Mild normocytic anemia H&H stable    Pain control - [place] Controlled Substance Reporting System database was reviewed. and patient was instructed, not to drive, operate heavy machinery, perform activities at heights, swimming or participation in water activities or provide baby-sitting services while on Pain, Sleep and Anxiety Medications; until their outpatient Physician has advised to do so again. Also recommended to not to take more than prescribed Pain, Sleep and Anxiety Medications.  Consultants: None Procedures performed: None Disposition: Home Diet recommendation:  Discharge Diet Orders (From admission, onward)     Start     Ordered   02/27/22 0000  Diet - low sodium heart healthy        02/27/22 1025           Cardiac diet DISCHARGE MEDICATION: Allergies as of 02/27/2022       Reactions   Cauliflower [brassica Oleracea] Anaphylaxis        Medication List     STOP taking these medications  clotrimazole 1 % cream Commonly known as: LOTRIMIN   HYDROcodone-acetaminophen 7.5-325 mg/15 ml solution Commonly known as: HYCET       TAKE these medications    folic acid 1 MG tablet Commonly known as: FOLVITE Take 1 tablet (1 mg total) by mouth daily. Start taking on: February 28, 2022   ibuprofen 200 MG tablet Commonly  known as: ADVIL Take 400 mg by mouth every 6 (six) hours as needed for headache or moderate pain.   neomycin-bacitracin-polymyxin [phone] Oint Apply 1 Application topically 2 (two) times daily.   thiamine 100 MG tablet Take 1 tablet (100 mg total) by mouth daily. Start taking on: February 28, 2022        Follow-up Information     Osei-Bonsu, Greggory Stallion, MD Follow up on 03/06/2022.   Specialty: Internal Medicine Why: Your appointment is at 3:30 pm. Please arrive early and bring a picture ID, current medications and insurance card Contact information: [address] [place] [address] [phone]                Discharge Exam: Ceasar Mons Weights   02/25/22 0130 02/25/22 0623  Weight: 63.5 kg 61.8 kg   S: Ambulating in the room, no dizziness, tolerating diet, wants to go home ASAP.  Vitals:   02/26/22 1352 02/26/22 1556 02/26/22 2000 02/27/22 0555  BP: 119/83 (!) 131/91 (!) 131/94 (!) 134/99  Pulse: 70  100 (!) 58  Resp: 18   18  Temp: 98.2 F (36.8 C) 98.2 F (36.8 C) 98 F (36.7 C) 97.8 F (36.6 C)  TempSrc: Oral Oral Oral Oral  SpO2: 98%  98% 97%  Weight:      Height:         Physical Exam General: Alert and oriented x 3, NAD Cardiovascular: S1 S2 clear, RRR. No pedal edema b/l Respiratory: CTAB, no wheezing, rales or rhonchi Gastrointestinal: Soft, nontender, nondistended, NBS Ext: no pedal edema bilaterally Neuro: no new deficits Psych: Normal affect and demeanor, alert and oriented x3    Condition at discharge: good  The results of significant diagnostics from this hospitalization (including imaging, microbiology, ancillary and laboratory) are listed below for reference.   Imaging Studies: CT HEAD WO CONTRAST ( )  Result Date: 02/25/2022 CLINICAL DATA:  Delirium EXAM: CT HEAD WITHOUT CONTRAST TECHNIQUE: Contiguous axial images were obtained from the base of the skull through the vertex without intravenous contrast. RADIATION DOSE REDUCTION:  This exam was performed according to the departmental dose-optimization program which includes automated exposure control, adjustment of the mA and/or kV according to patient size and/or use of iterative reconstruction technique. COMPARISON:  09/30/2021 FINDINGS: Brain: No evidence of acute infarction, hemorrhage, hydrocephalus, extra-axial collection or mass lesion/mass effect. Vascular: No hyperdense vessel or unexpected calcification. Skull: Normal. Negative for fracture or focal lesion. Sinuses/Orbits: No acute finding. IMPRESSION: Negative head CT. Electronically Signed   By: Tiburcio Pea M.D.   On: 02/25/2022 07:10   DG Chest Port 1 View  Result Date: 02/25/2022 CLINICAL DATA:  Possible sepsis EXAM: PORTABLE CHEST 1 VIEW COMPARISON:  06/14/2015 FINDINGS: Cardiac shadow is within normal limits. The lungs are well aerated bilaterally. No focal infiltrate or effusion is seen. No bony abnormality is noted. IMPRESSION: No active disease. Electronically Signed   By: Alcide Clever M.D.   On: 02/25/2022 00:32    Microbiology: Results for orders placed or performed during the hospital encounter of 02/24/22  Blood Culture (routine x 2)     Status: None (Preliminary result)  Collection Time: 02/25/22 12:51 AM   Specimen: BLOOD  Result Value Ref Range Status   Specimen Description BLOOD RIGHT ANTECUBITAL  Final   Special Requests   Final    BOTTLES DRAWN AEROBIC AND ANAEROBIC Blood Culture adequate volume   Culture   Final    NO GROWTH 2 DAYS Performed at [facility], [address] [place], [place] [address]    Report Status PENDING  Incomplete  Blood Culture (routine x 2)     Status: None (Preliminary result)   Collection Time: 02/25/22 12:51 AM   Specimen: BLOOD  Result Value Ref Range Status   Specimen Description BLOOD LEFT ELBOW  Final   Special Requests   Final    BOTTLES DRAWN AEROBIC AND ANAEROBIC Blood Culture adequate volume   Culture   Final    NO GROWTH 2 DAYS Performed  at [facility], [address] [place], [place] [address]    Report Status PENDING  Incomplete  Urine Culture     Status: None   Collection Time: 02/25/22  1:33 AM   Specimen: In/Out Cath Urine  Result Value Ref Range Status   Specimen Description IN/OUT CATH URINE  Final   Special Requests NONE  Final   Culture   Final    NO GROWTH Performed at [facility], [address] [place], [place] [address]    Report Status 02/26/2022 FINAL  Final    Labs: CBC: Recent Labs  Lab 02/25/22 0010 02/25/22 0708  WBC 8.5 8.8  NEUTROABS 5.5  --   HGB 12.2* 11.3*  HCT 40.1 33.5*  MCV 98.5 91.8  PLT 409* 267   Basic Metabolic Panel: Recent Labs  Lab 02/25/22 0010 02/25/22 0708 02/26/22 0056 02/27/22 0136  NA 138 138 138 139  K 3.1* 3.3* 3.5 3.8  CL 98 106 106 106  CO2 12* 22 24 22   GLUCOSE 196* 87 105* 116*  BUN 14 10 14 9   CREATININE 1.53* 1.00 0.96 0.80  CALCIUM 9.6 8.6* 8.7* 9.0  MG 2.3  --  1.8 1.8   Liver Function Tests: Recent Labs  Lab 02/25/22 0010 02/25/22 0708 02/26/22 0056 02/27/22 0136  AST 54* 105* 193* 134*  ALT 24 28 41 39  ALKPHOS 74 59 56 72  BILITOT 0.8 1.0 1.0 0.6  PROT 7.8 6.2* 6.2* 5.8*  ALBUMIN 4.5 3.5 3.4* 3.1*    Discharge time spent: greater than 30 minutes.  Signed: 02/28/22, MD [facility] 02/27/2022

## 2022-02-27 NOTE — Progress Notes (Signed)
PROGRESS NOTE    John [name]  ZOX:[id number] DOB: Mar 10, 1986 DOA: 02/24/2022 PCP: Patient, No Pcp Per   Brief Narrative:  36 year old with history of polysubstance abuse came to the ED for agitation after using ecstasy around 5 PM.  Around 11 PM patient became agitated started grabbing his penis claiming that there was something in the night.  Upon admission he was febrile, tachycardic with elevated lactate.  UDS was positive for amphetamine and THC.  Poison control was notified.  In the beginning he was given ziprasidone for severe agitation.  Due to severe rhabdomyolysis patient received aggressive IV fluids.   Assessment & Plan:  Principal Problem:   MDMA abuse (HCC) Active Problems:   Serotonin syndrome   Polysubstance abuse (HCC)   Rhabdomyolysis   AKI (acute kidney injury) (HCC)   Hypokalemia   QT prolongation   Lactic acidosis   Elevated transaminase level   Alcohol abuse   Normocytic anemia    Serotonin syndrome secondary to MDMA (ecstasy) intoxication/overdose Polysubstance abuse Metabolic acidosis, improved -Severe agitation, fever, tachycardia and hypotension secondary to [facility] use.  UDS positive for amphetamine and THC.  Initially ziprasidone given in the ED which helped his agitation.  Poison control was also notified.  CT head was negative, no obvious source of infection was identified.  EKG showed sinus tachycardia.  These appear to be improving at this time.   Mild rhabdomyolysis - CK levels initially showed improvement but then started rising again and keep that elevated K.  Plans to trended over next 24 hours   AKI, resolved Admission creatinine 1.5.  Resolved   Mild hypokalemia QT prolongation -Repletion   Severe lactic acidosis -Resolved with fluids   Mild elevation of transaminase level -Improved.  AST still elevated.  Remained stable continue to monitor.  Follow-up outpatient PCP   SIRS -At this time I do not see any evidence of infection.  No  need for antibiotics   Alcohol abuse -CIWA protocol; Ativan as needed -Thiamine, folate, multivitamin   Mild normocytic anemia No significant change from baseline.  No signs of active bleeding. -Continue to monitor    DVT prophylaxis: SCDs Start: 02/25/22 0549 Code Status: Full code Family Communication:    CK continues to rise therefore we will continue to trend over next 24 hours to ensure it improves prior to discharging him.    Subjective: Patient seen and examined at bedside, denies any myalgia.  Keeps reporting of pain in his penis.  Examination:  Constitutional: Not in acute distress Respiratory: Clear to auscultation bilaterally Cardiovascular: Normal sinus rhythm, no rubs Abdomen: Nontender nondistended good bowel sounds Musculoskeletal: No edema noted Skin: Some abrasion noted over his skin of his penis but no acute abnormality noted Neurologic: CN 2-12 grossly intact.  And nonfocal Psychiatric: Normal judgment and insight. Alert and oriented x 3. Normal mood.  Performed with the help of chaperone with nurse Bonita Quin.  Objective: Vitals:   02/26/22 1352 02/26/22 1556 02/26/22 2000 02/27/22 0555  BP: 119/83 (!) 131/91 (!) 131/94 (!) 134/99  Pulse: 70  100 (!) 58  Resp: 18   18  Temp: 98.2 F (36.8 C) 98.2 F (36.8 C) 98 F (36.7 C) 97.8 F (36.6 C)  TempSrc: Oral Oral Oral Oral  SpO2: 98%  98% 97%  Weight:      Height:        Intake/Output Summary (Last 24 hours) at 02/27/2022 1015 Last data filed at 02/27/2022 0900 Gross per 24 hour  Intake 4851.58 ml  Output 1850 ml  Net 3001.58 ml   Filed Weights   02/25/22 0130 02/25/22 0623  Weight: 63.5 kg 61.8 kg     Data Reviewed:   CBC: Recent Labs  Lab 02/25/22 0010 02/25/22 0708  WBC 8.5 8.8  NEUTROABS 5.5  --   HGB 12.2* 11.3*  HCT 40.1 33.5*  MCV 98.5 91.8  PLT 409* 267   Basic Metabolic Panel: Recent Labs  Lab 02/25/22 0010 02/25/22 0708 02/26/22 0056 02/27/22 0136  NA 138 138 138  139  K 3.1* 3.3* 3.5 3.8  CL 98 106 106 106  CO2 12* 22 24 22   GLUCOSE 196* 87 105* 116*  BUN 14 10 14 9   CREATININE 1.53* 1.00 0.96 0.80  CALCIUM 9.6 8.6* 8.7* 9.0  MG 2.3  --  1.8 1.8   GFR: Estimated Creatinine Clearance: 101.7 mL/min (by C-G formula based on SCr of 0.8 mg/dL). Liver Function Tests: Recent Labs  Lab 02/25/22 0010 02/25/22 0708 02/26/22 0056 02/27/22 0136  AST 54* 105* 193* 134*  ALT 24 28 41 39  ALKPHOS 74 59 56 72  BILITOT 0.8 1.0 1.0 0.6  PROT 7.8 6.2* 6.2* 5.8*  ALBUMIN 4.5 3.5 3.4* 3.1*   No results for input(s): "LIPASE", "AMYLASE" in the last 168 hours. No results for input(s): "AMMONIA" in the last 168 hours. Coagulation Profile: Recent Labs  Lab 02/25/22 0708  INR 1.0   Cardiac Enzymes: Recent Labs  Lab 02/25/22 0708 02/25/22 1513 02/26/22 0056 02/26/22 1403 02/27/22 0136  CKTOTAL 6,829* 5,620* 11,250* 8,908* 7,197*   BNP (last 3 results) No results for input(s): "PROBNP" in the last 8760 hours. HbA1C: No results for input(s): "HGBA1C" in the last 72 hours. CBG: No results for input(s): "GLUCAP" in the last 168 hours. Lipid Profile: No results for input(s): "CHOL", "HDL", "LDLCALC", "TRIG", "CHOLHDL", "LDLDIRECT" in the last 72 hours. Thyroid Function Tests: No results for input(s): "TSH", "T4TOTAL", "FREET4", "T3FREE", "THYROIDAB" in the last 72 hours. Anemia Panel: No results for input(s): "VITAMINB12", "FOLATE", "FERRITIN", "TIBC", "IRON", "RETICCTPCT" in the last 72 hours. Sepsis Labs: Recent Labs  Lab 02/25/22 0010 02/25/22 0247 02/25/22 0708  PROCALCITON  --   --  1.75  LATICACIDVEN >9.0* 2.5* 1.1    Recent Results (from the past 240 hour(s))  Blood Culture (routine x 2)     Status: None (Preliminary result)   Collection Time: 02/25/22 12:51 AM   Specimen: BLOOD  Result Value Ref Range Status   Specimen Description BLOOD RIGHT ANTECUBITAL  Final   Special Requests   Final    BOTTLES DRAWN AEROBIC AND ANAEROBIC  Blood Culture adequate volume   Culture   Final    NO GROWTH 2 DAYS Performed at [facility], [address], [place], [address] [place]    Report Status PENDING  Incomplete  Blood Culture (routine x 2)     Status: None (Preliminary result)   Collection Time: 02/25/22 12:51 AM   Specimen: BLOOD  Result Value Ref Range Status   Specimen Description BLOOD LEFT ELBOW  Final   Special Requests   Final    BOTTLES DRAWN AEROBIC AND ANAEROBIC Blood Culture adequate volume   Culture   Final    NO GROWTH 2 DAYS Performed at [facility], [address], [place], [address] [place]    Report Status PENDING  Incomplete  Urine Culture     Status: None   Collection Time: 02/25/22  1:33 AM   Specimen: In/Out  Cath Urine  Result Value Ref Range Status   Specimen Description IN/OUT CATH URINE  Final   Special Requests NONE  Final   Culture   Final    NO GROWTH Performed at [facility], [address], [place], [place] [address]    Report Status 02/26/2022 FINAL  Final         Radiology Studies: No results found.      Scheduled Meds:  folic acid  1 mg Oral Daily   multivitamin with minerals  1 tablet Oral Daily   neomycin-bacitracin-polymyxin   Topical BID   thiamine  100 mg Oral Daily   Or   thiamine  100 mg Intravenous Daily   Continuous Infusions:     LOS: 2 days   Time spent= 35 mins    John Cubero Joline Maxcy, MD [facility]  If 7PM-7AM, please contact night-coverage  02/27/2022, 10:15 AM

## 2022-02-27 NOTE — TOC Transition Note (Signed)
Transition of Care [facility]) - CM/SW Discharge Note   Patient Details  Name: John [name] MRN: [id number] Date of Birth: December 02, 1985  [facility] CM/SW Contact:  Kermit Balo, RN Phone Number: 02/27/2022, 11:13 AM   Clinical Narrative:    Pt discharging home with self care. New PCP on AVS.  Pt has transportation home.    Final next level of care: Home/Self Care Barriers to Discharge: No Barriers Identified   Patient Goals and CMS Choice        Discharge Placement                       Discharge Plan and Services   Discharge Planning Services: CM Consult                                 Social Determinants of Health (SDOH) Interventions     Readmission Risk Interventions     No data to display

## 2022-03-02 LAB — CULTURE, BLOOD (ROUTINE X 2)
Culture: NO GROWTH
Culture: NO GROWTH
Special Requests: ADEQUATE
Special Requests: ADEQUATE

## 2022-05-08 ENCOUNTER — Encounter (HOSPITAL_COMMUNITY): Payer: Self-pay | Admitting: Emergency Medicine

## 2022-05-08 ENCOUNTER — Emergency Department (HOSPITAL_COMMUNITY)
Admission: EM | Admit: 2022-05-08 | Discharge: 2022-05-08 | Discharge status: Against Medical Advice | Payer: 59 | Attending: Emergency Medicine | Admitting: Emergency Medicine

## 2022-05-08 ENCOUNTER — Other Ambulatory Visit: Payer: Self-pay

## 2022-05-08 ENCOUNTER — Emergency Department (HOSPITAL_COMMUNITY): Payer: 59

## 2022-05-08 DIAGNOSIS — S61313A Laceration without foreign body of left middle finger with damage to nail, initial encounter: Secondary | ICD-10-CM | POA: Diagnosis not present

## 2022-05-08 DIAGNOSIS — S65503A Unspecified injury of blood vessel of left middle finger, initial encounter: Secondary | ICD-10-CM | POA: Diagnosis present

## 2022-05-08 DIAGNOSIS — W260XXA Contact with knife, initial encounter: Secondary | ICD-10-CM | POA: Insufficient documentation

## 2022-05-08 DIAGNOSIS — Z23 Encounter for immunization: Secondary | ICD-10-CM | POA: Diagnosis not present

## 2022-05-08 DIAGNOSIS — Z5321 Procedure and treatment not carried out due to patient leaving prior to being seen by health care provider: Secondary | ICD-10-CM

## 2022-05-08 MED ORDER — TETANUS-DIPHTH-ACELL PERTUSSIS 5-2.5-18.5 LF-MCG/0.5 IM SUSY
0.5000 mL | PREFILLED_SYRINGE | Freq: Once | INTRAMUSCULAR | Status: AC
Start: 2022-05-08 — End: 2022-05-08
  Administered 2022-05-08: 0.5 mL via INTRAMUSCULAR
  Filled 2022-05-08: qty 0.5

## 2022-05-08 MED ORDER — LIDOCAINE HCL (PF) 1 % IJ SOLN
10.0000 mL | Freq: Once | INTRAMUSCULAR | Status: AC
  Administered 2022-05-08: 10 mL
  Filled 2022-05-08: qty 10

## 2022-05-08 MED ORDER — DOXYCYCLINE HYCLATE 100 MG PO CAPS: 100.0000 mg | ORAL_CAPSULE | Freq: Two times a day (BID) | ORAL | 0 refills | Status: DC

## 2022-05-08 NOTE — ED Notes (Signed)
PA at bedside for suture repair. 

## 2022-05-08 NOTE — ED Provider Notes (Signed)
[facility] EMERGENCY DEPARTMENT Provider Note   CSN: [id number] Arrival date & time: 05/08/22  1159     History Chief Complaint  Patient presents with   Finger Injury    John [name] is a 36 y.o. male with history of polysubstance abuse presents the emergency department for evaluation of laceration when it he accidentally cut the tip of his finger at 0945.  He does not remember the last tetanus shot was.  He is right-hand dominant.  Denies any allergies to any medications.  HPI     Home Medications Prior to Admission medications   Medication Sig Start Date End Date Taking? Authorizing Provider  folic acid (FOLVITE) 1 MG tablet Take 1 tablet (1 mg total) by mouth daily. 02/28/22   Rai, Ripudeep K, MD  ibuprofen (ADVIL) 200 MG tablet Take 400 mg by mouth every 6 (six) hours as needed for headache or moderate pain.    [provider]  neomycin-bacitracin-polymyxin [phone] OINT Apply 1 Application topically 2 (two) times daily. 02/27/22   Rai, Delene Ruffini, MD  thiamine 100 MG tablet Take 1 tablet (100 mg total) by mouth daily. 02/28/22   Cathren Harsh, MD      Allergies    Cauliflower [place] oleracea]    Review of Systems   Review of Systems  Musculoskeletal:        Reports finger pain    Physical Exam Updated Vital Signs BP 120/78 (BP Location: Right Arm)   Pulse 83   Temp 97.8 F (36.6 C)   Resp 18   SpO2 99%  Physical Exam Vitals and nursing note reviewed.  Constitutional:      Appearance: Normal appearance. He is not toxic-appearing.  Eyes:     General: No scleral icterus. Pulmonary:     Effort: Pulmonary effort is normal. No respiratory distress.  Musculoskeletal:        General: Signs of injury present.     Comments: LUE- radial pulse intact, cap refill brisk. Laceration noted to the radial aspect of the tip of the left middle finger with nail involvement. Please see picture. Skin appears macerated in imaging as it was in a  bandage and was irrigated. Numbness to the distal aspect of the finger.   Skin:    General: Skin is dry.     Findings: No rash.  Neurological:     General: No focal deficit present.     Mental Status: He is alert. Mental status is at baseline.  Psychiatric:        Mood and Affect: Mood normal.          ED Results / Procedures / Treatments   Labs (all labs ordered are listed, but only abnormal results are displayed) Labs Reviewed - No data to display  EKG None  Radiology No results found.  Procedures .Marland KitchenLaceration Repair  Date/Time: 05/08/2022 3:30 PM  Performed by: Achille Rich, PA-C Authorized by: Achille Rich, PA-C   Consent:    Consent obtained:  Verbal   Consent given by:  Patient   Risks, benefits, and alternatives were discussed: yes     Risks discussed:  Infection, need for additional repair, pain, poor cosmetic result, poor wound healing and nerve damage   Alternatives discussed:  No treatment and delayed treatment Universal protocol:    Procedure explained and questions answered to patient or proxy's satisfaction: yes     Imaging studies available: yes     Patient identity confirmed:  Verbally with patient  Anesthesia:    Anesthesia method:  Local infiltration   Local anesthetic:  Lidocaine 1% w/o epi Laceration details:    Location:  Finger   Finger location:  L long finger   Length (cm):  3 Pre-procedure details:    Preparation:  Patient was prepped and draped in usual sterile fashion and imaging obtained to evaluate for foreign bodies Exploration:    Hemostasis achieved with:  Tourniquet   Imaging obtained: x-ray     Imaging outcome: foreign body not noted     Wound exploration: wound explored through full range of motion and entire depth of wound visualized     Contaminated: no   Treatment:    Area cleansed with:  Saline   Amount of cleaning:  Standard   Irrigation solution:  Sterile saline   Irrigation volume:  1L   Irrigation method:   Syringe Skin repair:    Repair method:  Sutures   Suture size:  4-0   Suture material:  Prolene   Suture technique:  Simple interrupted   Number of sutures:  6 Approximation:    Approximation:  Close Repair type:    Repair type:  Intermediate Post-procedure details:    Dressing:  Non-adherent dressing and antibiotic ointment   Procedure completion:  Tolerated well, no immediate complications    Medications Ordered in ED Medications  Tdap (BOOSTRIX) injection 0.5 mL (has no administration in time range)  lidocaine (PF) (XYLOCAINE) 1 % injection 10 mL (has no administration in time range)    ED Course/ Medical Decision Making/ A&P Clinical Course as of 05/08/22 1824  Wed May 08, 2022  1615 This is a 36-year male presented to ED with a laceration to his left second finger.  Last is across the lateral and distal aspect of the nailbed.  X-ray personally viewed interpreted showing no acute underlying fracture.  We are able to perform a digital block and achieve hemostasis with a finger tourniquet.  Laceration was repaired by PA provider at the bedside.  Tetanus is updated.  The wound was copiously cleared prior to repair.  No evidence of infection at this time.  I would anticipate discharge if the wound remains hemostatic [MT]    Clinical Course User Index [MT] Trifan, Kermit Balo, MD                           Medical Decision Making Amount and/or Complexity of Data Reviewed Radiology: ordered.  Risk Prescription drug management.   36 year old male presents the emergency department for evaluation of left middle finger laceration.  Differential diagnosis includes but is not limited to laceration versus open fracture.  Vital signs are unremarkable.  Exam as noted above.  Please see images.  X-ray imaging shows no fracture or radio-opaque foreign body.  Wound was cleaned thoroughly with 1 L of normal saline. The wound was bleeding, however was able to be controlled with lidocaine and  finger tourniquet. After finger tourniquet removed and the sutures were in place after the procedure, the wound was not bleeding. Will monitor for a little while longer to check for hemostasis.   Please see procedure note.  Tolerated procedure well.  Six sutures in place.  Attending assessed at bedside and like the patient be placed on doxycycline to prevent any infection.  Tetanus was up-to-date.  Will wait to see if the wound re-bleeds.   Was informed by nursing that the patient eloped. Nursing informed me that she did  bandage the finger before he left and that there was not any blood left on the chuck. I attempted to call the patient with the number on the chart to discuss return precautions, suture removal, and antibiotics, however the patient did not answer and there is no voicemail. Will send in doxycycline to the pharmacy listed on his chart.   I discussed this case with my attending physician who cosigned this note including patient's presenting symptoms, physical exam, and planned diagnostics and interventions. Attending physician stated agreement with plan or made changes to plan which were implemented.   Attending physician assessed patient at bedside.   Final Clinical Impression(s) / ED Diagnoses Final diagnoses:  Laceration of left middle finger without foreign body with damage to nail, initial encounter  Eloped from emergency department    Rx / DC Orders ED Discharge Orders          Ordered    doxycycline (VIBRAMYCIN) 100 MG capsule  2 times daily        05/08/22 1830              Achille Rich, [place] 05/08/22 1831    Terald Sleeper, MD 05/09/22 [phone]

## 2022-05-08 NOTE — ED Notes (Addendum)
Pt wound/hand cleaned and dressed. Pt able to move fingers and finger appeared well profused. Pt denies pain at this time.

## 2022-05-08 NOTE — Discharge Instructions (Addendum)
You were seen in the ER today for evaluation of your left middle finger.  There was no fracture or foreign body noted.  We were able to repair your finger with sutures.  You will need to return to the urgent care, emergency department, or primary care office for removal of these in 7 days.  He can take ibuprofen or Tylenol as needed for pain.  Additionally, we have placed you on doxycycline to prevent any infection.  You need to take this twice daily for the next 7 days.  If you have any concerns, new or worsening symptoms, please return the nearest emergency room for evaluation.  Contact a doctor if: You got a tetanus shot and you have any of these problems where the needle went in: Swelling. Very bad pain. Redness. Bleeding. A wound that was closed breaks open. You have a fever. You have any of these signs of infection in your wound: More redness, swelling, or pain. Fluid or blood. Warmth. Pus or a bad smell. You see something coming out of the wound, such as wood or glass. Medicine does not make your pain go away. You notice a change in the color of your skin near your wound. You need to change the bandage often. You have a new rash. You lose feeling (have numbness) around the wound. Get help right away if: You have very bad swelling around the wound. Your pain suddenly gets worse and is very bad. You have painful lumps near the wound or on skin anywhere on your body. You have a red streak going away from your wound. The wound is on your hand or foot, and: You cannot move a finger or toe. Your fingers or toes look pale or bluish.

## 2022-05-08 NOTE — ED Triage Notes (Signed)
Patient here with complaint of injury to left middle finger that was accidentally inflicted while cutting vegetables at 0945 today. Patient unsure of last tetanus. Patient is alert, oriented, and in no apparent distress at this time.

## 2022-05-08 NOTE — ED Notes (Signed)
Notified by other staff that pt decided to leave. Pt unable to be found in department. PA notified.

## 2023-01-24 ENCOUNTER — Encounter (HOSPITAL_COMMUNITY): Payer: Self-pay

## 2023-01-24 ENCOUNTER — Emergency Department (HOSPITAL_COMMUNITY): Payer: Self-pay

## 2023-01-24 ENCOUNTER — Emergency Department (HOSPITAL_COMMUNITY)
Admission: EM | Admit: 2023-01-24 | Discharge: 2023-01-24 | Disposition: A | Discharge status: Home / Self Care | Payer: Self-pay | Attending: Emergency Medicine | Admitting: Emergency Medicine

## 2023-01-24 DIAGNOSIS — T43625A Adverse effect of amphetamines, initial encounter: Secondary | ICD-10-CM | POA: Insufficient documentation

## 2023-01-24 DIAGNOSIS — F419 Anxiety disorder, unspecified: Secondary | ICD-10-CM | POA: Insufficient documentation

## 2023-01-24 DIAGNOSIS — N4889 Other specified disorders of penis: Secondary | ICD-10-CM | POA: Insufficient documentation

## 2023-01-24 DIAGNOSIS — R410 Disorientation, unspecified: Secondary | ICD-10-CM | POA: Insufficient documentation

## 2023-01-24 LAB — URINALYSIS, W/ REFLEX TO CULTURE (INFECTION SUSPECTED)
Bacteria, UA: NONE SEEN
Bilirubin Urine: NEGATIVE
Glucose, UA: NEGATIVE mg/dL
Ketones, ur: NEGATIVE mg/dL
Leukocytes,Ua: NEGATIVE
Nitrite: NEGATIVE
Protein, ur: NEGATIVE mg/dL
Specific Gravity, Urine: 1.011 (ref 1.005–1.030)
pH: 6 (ref 5.0–8.0)

## 2023-01-24 LAB — RAPID URINE DRUG SCREEN, HOSP PERFORMED
Amphetamines: POSITIVE — AB
Barbiturates: NOT DETECTED
Benzodiazepines: POSITIVE — AB
Cocaine: NOT DETECTED
Opiates: NOT DETECTED
Tetrahydrocannabinol: POSITIVE — AB

## 2023-01-24 LAB — COMPREHENSIVE METABOLIC PANEL
ALT: 19 U/L (ref 0–44)
AST: 42 U/L — ABNORMAL HIGH (ref 15–41)
Albumin: 4.2 g/dL (ref 3.5–5.0)
Alkaline Phosphatase: 65 U/L (ref 38–126)
Anion gap: 14 (ref 5–15)
BUN: 9 mg/dL (ref 6–20)
CO2: 21 mmol/L — ABNORMAL LOW (ref 22–32)
Calcium: 8.9 mg/dL (ref 8.9–10.3)
Chloride: 101 mmol/L (ref 98–111)
Creatinine, Ser: 1.17 mg/dL (ref 0.61–1.24)
GFR, Estimated: >60 mL/min (ref >60)
Glucose, Bld: 146 mg/dL — ABNORMAL HIGH (ref 70–99)
Potassium: 3.4 mmol/L — ABNORMAL LOW (ref 3.5–5.1)
Sodium: 136 mmol/L (ref 135–145)
Total Bilirubin: 1.1 mg/dL (ref 0.3–1.2)
Total Protein: 7.4 g/dL (ref 6.5–8.1)

## 2023-01-24 LAB — CBC WITH DIFFERENTIAL/PLATELET
Abs Immature Granulocytes: 0.04 10*3/uL (ref 0.00–0.07)
Basophils Absolute: 0 10*3/uL (ref 0.0–0.1)
Basophils Relative: 0 %
Eosinophils Absolute: 0 10*3/uL (ref 0.0–0.5)
Eosinophils Relative: 0 %
HCT: 39.3 % (ref 39.0–52.0)
Hemoglobin: 12.5 g/dL — ABNORMAL LOW (ref 13.0–17.0)
Immature Granulocytes: 0 %
Lymphocytes Relative: 7 %
Lymphs Abs: 0.9 10*3/uL (ref 0.7–4.0)
MCH: 29.5 pg (ref 26.0–34.0)
MCHC: 31.8 g/dL (ref 30.0–36.0)
MCV: 92.7 fL (ref 80.0–100.0)
Monocytes Absolute: 1 10*3/uL (ref 0.1–1.0)
Monocytes Relative: 9 %
Neutro Abs: 9.9 10*3/uL — ABNORMAL HIGH (ref 1.7–7.7)
Neutrophils Relative %: 84 %
Platelets: 306 10*3/uL (ref 150–400)
RBC: 4.24 MIL/uL (ref 4.22–5.81)
RDW: 12.1 % (ref 11.5–15.5)
WBC: 11.9 10*3/uL — ABNORMAL HIGH (ref 4.0–10.5)
nRBC: 0 % (ref 0.0–0.2)

## 2023-01-24 LAB — ETHANOL: Alcohol, Ethyl (B): <10 mg/dL (ref <10)

## 2023-01-24 MED ORDER — SODIUM CHLORIDE 0.9 % IV BOLUS
1000.0000 mL | Freq: Once | INTRAVENOUS | Status: AC
  Administered 2023-01-24: 1000 mL via INTRAVENOUS

## 2023-01-24 MED ORDER — SODIUM CHLORIDE 0.9 % IV SOLN: INTRAVENOUS | Status: DC

## 2023-01-24 MED ORDER — DIAZEPAM 5 MG/ML IJ SOLN
10.0000 mg | Freq: Once | INTRAMUSCULAR | Status: AC
  Administered 2023-01-24: 10 mg via INTRAVENOUS
  Filled 2023-01-24: qty 2

## 2023-01-24 NOTE — Discharge Instructions (Signed)
Avoid using methamphetamine.  Drink plenty of fluids, and make sure to get some rest
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
## 2023-01-24 NOTE — ED Triage Notes (Addendum)
Pt BIBA from home. Pt took a gas station Viagra. Pt c/o penile pain. Pt's penis is flaccid per EMS. Pt is constantly touching penis region.  Pt also drank ETOH and marijuana last night.  5 versed IM en route to try and calm pt down for assessment.

## 2023-06-21 ENCOUNTER — Encounter (HOSPITAL_COMMUNITY): Payer: Self-pay

## 2023-06-21 ENCOUNTER — Ambulatory Visit (INDEPENDENT_AMBULATORY_CARE_PROVIDER_SITE_OTHER): Payer: PRIVATE HEALTH INSURANCE

## 2023-06-21 ENCOUNTER — Ambulatory Visit (HOSPITAL_COMMUNITY)
Admission: EM | Admit: 2023-06-21 | Discharge: 2023-06-21 | Disposition: A | Discharge status: Home / Self Care | Payer: Self-pay | Attending: Family Medicine | Admitting: Family Medicine

## 2023-06-21 DIAGNOSIS — M25571 Pain in right ankle and joints of right foot: Secondary | ICD-10-CM | POA: Diagnosis not present

## 2023-06-21 DIAGNOSIS — M25561 Pain in right knee: Secondary | ICD-10-CM | POA: Diagnosis not present

## 2023-06-21 DIAGNOSIS — S82839A Other fracture of upper and lower end of unspecified fibula, initial encounter for closed fracture: Secondary | ICD-10-CM

## 2023-06-21 DIAGNOSIS — M79661 Pain in right lower leg: Secondary | ICD-10-CM

## 2023-06-21 MED ORDER — IBUPROFEN 800 MG PO TABS: 800.0000 mg | ORAL_TABLET | Freq: Three times a day (TID) | ORAL | 0 refills | Status: DC

## 2023-06-21 NOTE — Progress Notes (Signed)
Orthopedic Tech Progress Note Patient Details:  John [name] [birth year] [id number]  Patient ID: John [name], male   DOB: 10-24-1985, 37 y.o.   MRN: [id number] Patient refused splint. Darleen Crocker 06/21/2023, 12:38 PM

## 2023-06-21 NOTE — ED Triage Notes (Signed)
Patient here today with c/o right ankle injury 1 week ago. Patient was skateboarding and hit a pothole.

## 2023-06-21 NOTE — ED Notes (Signed)
This RN called ortho tech to apply splint.

## 2023-06-21 NOTE — ED Provider Notes (Signed)
[facility]   [id number] 06/21/23 Arrival Time: 1003  ASSESSMENT & PLAN:  1. Acute pain of right knee   2. Acute right ankle pain   3. Pain in right lower leg   4. Closed fracture of distal end of fibula, unspecified fracture morphology, initial encounter    I have personally viewed and independently interpreted the imaging studies ordered this visit. R knee: no acute changes. R tib/fib: distal oblique fracture of fibula R ankle: distal oblique fracture of fibula  RLE neuro/vasc intact.  Meds ordered this encounter  Medications   ibuprofen (ADVIL) 800 MG tablet    Sig: Take 1 tablet (800 mg total) by mouth 3 (three) times daily with meals.    Dispense:  21 tablet    Refill:  0   Orders Placed This Encounter  Procedures   DG Knee Complete 4 Views Right   DG Tibia/Fibula Right   DG Ankle Complete Right   Work/school excuse note: provided.  Declines fiberglass splinting. CAM boot and crutches provided.  Recommend:   Follow-up Information     Schedule an appointment as soon as possible for a visit  with [facility]).   Contact information: [address] [place],  [place]  [address]  [phone]               Reviewed expectations re: course of current medical issues. Questions answered. Outlined signs and symptoms indicating need for more acute intervention. Patient verbalized understanding. After Visit Summary given.  SUBJECTIVE: History from: patient. John [name] is a 37 y.o. male who reports fall from skateboard; approx 1 wk ago. Immed ankle pain but shortly afterwards noted leg and knee painful. Difficult to bear wt at first but is bearing wt more now. No extremity sensation changes or weakness. No tx PTA. Denies head injury.  Past Surgical History:  Procedure Laterality Date   HERNIA REPAIR     ORIF MANDIBULAR FRACTURE N/A 09/30/2021   Procedure: OPEN REDUCTION INTERNAL FIXATION (ORIF) MANDIBULAR  FRACTURE;  Surgeon: Christia Reading, MD;  Location: [facility] OR;  Service: ENT;  Laterality: N/A;     OBJECTIVE:  Vitals:   06/21/23 1021  BP: 135/81  Pulse: 76  Resp: 16  Temp: (!) 97.4 F (36.3 C)  TempSrc: Oral  SpO2: 99%  Weight: 65.8 kg  Height: 5\' 3"  (1.6 m)    General appearance: alert; no distress HEENT: [place]; AT Neck: supple with FROM Resp: unlabored respirations Extremities: RLE: warm with well perfused appearance; poorly localized TTP over knee down through mid leg and over lateral ankle; knee and ankle with FROM but with reported pain CV: brisk extremity capillary refill of RLE; 2+ DP pulse of RLE. Skin: warm and dry; no visible rashes Neurologic: able to bear wt on RLE with resulting normal gait; normal sensation and strength of RLE Psychological: alert and cooperative; normal mood and affect  Imaging: DG Ankle Complete Right  Result Date: 06/21/2023 CLINICAL DATA:  37 year old male with history of right ankle pain after after a fall off of a skateboard. EXAM: RIGHT ANKLE - COMPLETE 3+ VIEW COMPARISON:  No priors. FINDINGS: Three views of the right ankle demonstrate an oblique mildly displaced fracture of the distal metadiaphyseal region of the fibula extending into the lateral malleolus, best appreciated on the lateral projection where there is approximately 5 mm of posterior displacement. Overlying soft tissues are swollen adjacent to the lateral malleolus. Distal tibia and visualized portions of the foot are otherwise intact.  IMPRESSION: 1. Oblique minimally displaced fracture of the distal fibula, as above. Electronically Signed   By: Trudie Reed M.D.   On: 06/21/2023 11:45   DG Tibia/Fibula Right  Result Date: 06/21/2023 CLINICAL DATA:  37 year old male with history of trauma from a fall complaining of right leg obtained. EXAM: RIGHT TIBIA AND FIBULA - 2 VIEW COMPARISON:  No priors. FINDINGS: There is an oblique fracture of the distal fibula involving the distal  metadiaphyseal region extending to the lateral malleolus best appreciated on the lateral projection where there is approximately 5 mm of posterior displacement. Soft tissues are swollen overlying the lateral malleolus. Tibia is intact. IMPRESSION: 1. Oblique minimally displaced fracture of the distal fibula with overlying soft tissue swelling, as above. Electronically Signed   By: Trudie Reed M.D.   On: 06/21/2023 11:41   DG Knee Complete 4 Views Right  Result Date: 06/21/2023 CLINICAL DATA:  37 year old male with history of trauma from a fall complaining of right knee pain. EXAM: RIGHT KNEE - COMPLETE 4+ VIEW COMPARISON:  No priors. FINDINGS: No evidence of fracture, dislocation, or joint effusion. No evidence of arthropathy or other focal bone abnormality. Soft tissues are unremarkable. IMPRESSION: Negative. Electronically Signed   By: Trudie Reed M.D.   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 No pertinent past medical history. Social History   Socioeconomic History   Marital status: Single    Spouse name: Not on file   Number of children: Not on file   Years of education: Not on file   Highest education level: Not on file  Occupational History   Not on file  Tobacco Use   Smoking status: Every Day    Types: Cigarettes   Smokeless tobacco: Never  Substance and Sexual Activity   Alcohol use: Yes    Comment: shots on wekend   Drug use: Yes    Types: Marijuana    Comment: few days ago   Sexual activity: Not on file  Other Topics Concern   Not on file  Social History Narrative   Not on file   Social Determinants of Health   Financial Resource Strain: Not on file  Food Insecurity: Not on file  Transportation Needs: Not on file  Physical Activity: Not on file  Stress: Not on file  Social Connections: Not on file   History reviewed. No pertinent family history. Past Surgical  History:  Procedure Laterality Date   HERNIA REPAIR     ORIF MANDIBULAR FRACTURE N/A 09/30/2021   Procedure: OPEN REDUCTION INTERNAL FIXATION (ORIF) MANDIBULAR FRACTURE;  Surgeon: Christia Reading, MD;  Location: [facility] OR;  Service: ENT;  Laterality: Vertis Kelch, MD 06/23/23 1114

## 2023-08-21 ENCOUNTER — Ambulatory Visit: Payer: Self-pay | Admitting: Podiatry

## 2023-09-09 NOTE — Progress Notes (Signed)
 New patient visit   Patient: John [name]   DOB: 11/17/1985   38 y.o. Male  MRN: [id number] Visit Date: 09/10/2023  Today's healthcare provider: Manuelita Flatness, PA-C   Cc. New patient, nears clearance for work  Subjective    John [name] is a 38 y.o. male who presents today as a new patient to establish care.   Patient states the only thing he needs today is clearance for work. He fractured his right leg in October, and his work is requesting a full return to work clearance w/o restrictions. He reports he was never written out of work, and has been working with the fracture. New management is requiring a medical provider certify he is safe to work. He sustained the broken leg while riding his skateboard.  He states urgent care wanted to cast him, but he refused and went home in a boot instead. He reports wearing it for a little while but does not present in a boot today.  History reviewed. No pertinent past medical history. Past Surgical History:  Procedure Laterality Date   HERNIA REPAIR     ORIF MANDIBULAR FRACTURE N/A 09/30/2021   Procedure: OPEN REDUCTION INTERNAL FIXATION (ORIF) MANDIBULAR FRACTURE;  Surgeon: Carlie Clark, MD;  Location: [facility] OR;  Service: ENT;  Laterality: N/A;   No family status information on file.   History reviewed. No pertinent family history. Social History   Socioeconomic History   Marital status: Single    Spouse name: Not on file   Number of children: Not on file   Years of education: Not on file   Highest education level: Not on file  Occupational History   Not on file  Tobacco Use   Smoking status: Every Day    Types: Cigarettes   Smokeless tobacco: Never  Vaping Use   Vaping status: Not on file  Substance and Sexual Activity   Alcohol use: Yes    Comment: shots on wekend   Drug use: Yes    Types: Marijuana    Comment: few days ago   Sexual activity: Yes    Birth control/protection: None  Other Topics Concern   Not on file   Social History Narrative   Not on file   Social Drivers of Health   Financial Resource Strain: Not on file  Food Insecurity: Not on file  Transportation Needs: Not on file  Physical Activity: Not on file  Stress: Not on file  Social Connections: Not on file   Outpatient Medications Prior to Visit  Medication Sig   [DISCONTINUED] ibuprofen  (ADVIL ) 800 MG tablet Take 1 tablet (800 mg total) by mouth 3 (three) times daily with meals.   No facility-administered medications prior to visit.   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    Immunization History  Administered Date(s) Administered   DTaP 12/04/1989   Hepatitis B, PED/ADOLESCENT 06/01/1996, 07/06/1996, 12/07/1996   Polio, Unspecified 12/04/1989   Tdap 05/08/2022    Health Maintenance  Topic Date Due   Hepatitis C Screening  Never done   COVID-19 Vaccine (1 - 2024-25 season) Never done   INFLUENZA VACCINE  12/01/2023 (Originally 04/03/2023)   DTaP/Tdap/Td (3 - Td or Tdap) 05/08/2032   HIV Screening  Completed   HPV VACCINES  Aged Out    Patient Care Team: Flatness Manuelita, PA-C as PCP - General (Physician Assistant)  Review of Systems  Constitutional:  Negative for fatigue and fever.  Respiratory:  Negative for cough and shortness  of breath.   Cardiovascular:  Negative for chest pain, palpitations and leg swelling.  Neurological:  Negative for dizziness and headaches.        Objective    BP 106/75   Pulse 86   Temp 97.6 F (36.4 C) (Oral)   Ht 5' 3 (1.6 m)   Wt 136 lb 7 oz (61.9 kg)   SpO2 100%   BMI 24.17 kg/m     Physical Exam Vitals reviewed.  Constitutional:      Appearance: He is not ill-appearing.     Comments: Overwhelming smell of marijuana   HENT:     Head: Normocephalic.  Eyes:     Conjunctiva/sclera: Conjunctivae normal.  Cardiovascular:     Rate and Rhythm: Normal rate.  Pulmonary:     Effort: Pulmonary effort is normal. No respiratory  distress.  Neurological:     General: No focal deficit present.     Mental Status: He is alert and oriented to person, place, and time.  Psychiatric:        Mood and Affect: Mood normal.        Behavior: Behavior is uncooperative.        Thought Content: Thought content normal.        Cognition and Memory: Cognition normal.     Comments: Pt frequently paces back and forth during appointment. Pt is argumentative.     Depression Screen    09/10/2023    2:21 PM  PHQ 2/9 Scores  PHQ - 2 Score 0   No results found for any visits on 09/10/23.  Assessment & Plan     Closed fracture of distal end of right fibula, unspecified fracture morphology, initial encounter Assessment & Plan: Review of urgent care visit 06/21/23, right tib/fib xray: Oblique minimally displaced fracture of the distal fibula with overlying soft tissue swelling.  I advised patient that unfortunately, as a primary care provider, I would not be able to adequately clear him for full duty from a right fibula fracture with limited treatment and no follow up. This is especially difficult with his short timeline of needing full clearance by tomorrow.   Recommending he be seen by orthopedics who can more appropriately evaluate for proper healing, if any subsequent treatment is needed, and clear him for work.  Given information for orthopedic urgent care if needed, and a formal referral was placed.  Orders: -     Ambulatory referral to Orthopedics   Manuelita Flatness, PA-C  [facility] [phone] (phone) [phone] (fax)  [facility]

## 2023-09-10 ENCOUNTER — Encounter: Payer: Self-pay | Admitting: Physician Assistant

## 2023-09-10 ENCOUNTER — Ambulatory Visit: Payer: Self-pay | Admitting: Physician Assistant

## 2023-09-10 VITALS — BP 106/75 | HR 86 | Temp 97.6°F | Ht 63.0 in | Wt 136.4 lb

## 2023-09-10 DIAGNOSIS — S82831A Other fracture of upper and lower end of right fibula, initial encounter for closed fracture: Secondary | ICD-10-CM

## 2023-09-10 NOTE — Patient Instructions (Addendum)
 [facility] Billing Department: [phone]

## 2023-09-10 NOTE — Assessment & Plan Note (Addendum)
 Review of urgent care visit 06/21/23, right tib/fib xray: Oblique minimally displaced fracture of the distal fibula with overlying soft tissue swelling.  I advised patient that unfortunately, as a primary care provider, I would not be able to adequately clear him for full duty from a right fibula fracture with limited treatment and no follow up. This is especially difficult with his short timeline of needing full clearance by tomorrow.   Recommending he be seen by orthopedics who can more appropriately evaluate for proper healing, if any subsequent treatment is needed, and clear him for work.  Given information for orthopedic urgent care if needed, and a formal referral was placed.

## 2023-09-12 ENCOUNTER — Encounter (HOSPITAL_COMMUNITY): Payer: Self-pay | Admitting: Emergency Medicine

## 2023-09-12 ENCOUNTER — Emergency Department (HOSPITAL_COMMUNITY)
Admission: EM | Admit: 2023-09-12 | Discharge: 2023-09-13 | Disposition: A | Discharge status: Home / Self Care | Payer: Self-pay | Attending: Emergency Medicine | Admitting: Emergency Medicine

## 2023-09-12 ENCOUNTER — Other Ambulatory Visit: Payer: Self-pay

## 2023-09-12 ENCOUNTER — Emergency Department (HOSPITAL_COMMUNITY): Payer: Self-pay

## 2023-09-12 DIAGNOSIS — M25571 Pain in right ankle and joints of right foot: Secondary | ICD-10-CM | POA: Insufficient documentation

## 2023-09-12 DIAGNOSIS — W25XXXA Contact with sharp glass, initial encounter: Secondary | ICD-10-CM | POA: Insufficient documentation

## 2023-09-12 DIAGNOSIS — S51812A Laceration without foreign body of left forearm, initial encounter: Secondary | ICD-10-CM | POA: Insufficient documentation

## 2023-09-12 MED ORDER — LIDOCAINE-EPINEPHRINE (PF) 2 %-1:200000 IJ SOLN
10.0000 mL | Freq: Once | INTRAMUSCULAR | Status: AC
  Administered 2023-09-12: 10 mL
  Filled 2023-09-12: qty 20
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
## 2023-09-12 NOTE — Discharge Instructions (Signed)
Avoid soaking your wound in stagnant or dirty water such as while taking a bath. You can shower normally. Keep the area clean with mild soap and warm water. Do not apply peroxide or alcohol to your wound as this can break down newly forming skin and prolong wound healing. If you keep the area bandaged, change the dressing/bandage at least once per day. Have your staples/sutures removed in 10-12 days.

## 2024-08-01 ENCOUNTER — Encounter (HOSPITAL_COMMUNITY): Payer: Self-pay | Admitting: Emergency Medicine

## 2024-08-01 ENCOUNTER — Emergency Department (HOSPITAL_COMMUNITY)
Admission: EM | Admit: 2024-08-01 | Discharge: 2024-08-01 | Disposition: A | Discharge status: Home / Self Care | Payer: Self-pay | Attending: Emergency Medicine | Admitting: Emergency Medicine

## 2024-08-01 DIAGNOSIS — R000 Tachycardia, unspecified: Secondary | ICD-10-CM | POA: Insufficient documentation

## 2024-08-01 DIAGNOSIS — N4889 Other specified disorders of penis: Secondary | ICD-10-CM | POA: Insufficient documentation

## 2024-08-01 MED ORDER — IBUPROFEN 400 MG PO TABS
600.0000 mg | ORAL_TABLET | Freq: Once | ORAL | Status: AC
  Administered 2024-08-01: 600 mg via ORAL
  Filled 2024-08-01: qty 1

## 2024-08-01 NOTE — ED Provider Notes (Signed)
  [place] EMERGENCY DEPARTMENT AT [facility] Provider Note   CSN: [id number] Arrival date & time: 08/01/24  8086     Patient presents with: Penis Injury   John [name] is a 38 y.o. male presents to the ED by EMS with concern for his penis feeling abnormal.  Patient ports he had sex earlier today did not have pain during sex.  Afterwards however he says he had a throbbing sensation with urination.  He does not regularly have pain near his penis or difficulties with urination.  He says he feels hot   HPI     Prior to Admission medications   Not on File    Allergies: Cauliflower [brassica oleracea]    Review of Systems  Updated Vital Signs BP (!) 157/97 (BP Location: Left Arm)   Pulse (!) 134   Temp 97.8 F (36.6 C) (Oral)   Resp 20   SpO2 97%   Physical Exam Constitutional:      General: He is not in acute distress. HENT:     Head: Normocephalic and atraumatic.  Eyes:     Conjunctiva/sclera: Conjunctivae normal.     Pupils: Pupils are equal, round, and reactive to light.     Comments: Dilated pupils  Cardiovascular:     Rate and Rhythm: Regular rhythm. Tachycardia present.  Pulmonary:     Effort: Pulmonary effort is normal. No respiratory distress.  Abdominal:     General: There is no distension.     Tenderness: There is no abdominal tenderness.  Genitourinary:    Comments: RN present as chaperone Normal circumcised penis, no evidence of ecchymosis or tenderness of the shaft of the penis, no erythema of the glans of the penis, mild post-coital drainage Skin:    General: Skin is warm and dry.  Neurological:     General: No focal deficit present.     Mental Status: He is alert. Mental status is at baseline.     (all labs ordered are listed, but only abnormal results are displayed) Labs Reviewed - No data to display  EKG: None  Radiology: No results found.   Procedures   Medications Ordered in the ED - No data to display                                   Medical Decision Making  Patient is here complaining of penile pain.  He has a normal GU exam.  I do suspect there is a toxidrome and she reports feeling hot, is tachycardic and has dilated pupils.  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  I did ask if he wants STI screening and he said no he does not.  He is stable for discharge     Final diagnoses:  None    ED Discharge Orders     None          Burnie Hank, [name] PARAS, MD 08/01/24 [name]
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
